# Patient Record
Sex: Female | Born: 2000 | Race: Black or African American | Hispanic: No | Marital: Single | Smoking: Never smoker
Health system: Southern US, Community
[De-identification: ages and names within clinical notes are randomized; demographics above are authoritative.]

## PROBLEM LIST (undated history)

## (undated) DIAGNOSIS — E282 Polycystic ovarian syndrome: Secondary | ICD-10-CM

## (undated) DIAGNOSIS — T7840XA Allergy, unspecified, initial encounter: Secondary | ICD-10-CM

## (undated) DIAGNOSIS — F419 Anxiety disorder, unspecified: Secondary | ICD-10-CM

## (undated) DIAGNOSIS — E559 Vitamin D deficiency, unspecified: Secondary | ICD-10-CM

## (undated) DIAGNOSIS — D649 Anemia, unspecified: Secondary | ICD-10-CM

## (undated) DIAGNOSIS — E119 Type 2 diabetes mellitus without complications: Secondary | ICD-10-CM

## (undated) DIAGNOSIS — F32A Depression, unspecified: Secondary | ICD-10-CM

## (undated) DIAGNOSIS — R011 Cardiac murmur, unspecified: Secondary | ICD-10-CM

## (undated) HISTORY — DX: Type 2 diabetes mellitus without complications: E11.9

## (undated) HISTORY — DX: Polycystic ovarian syndrome: E28.2

## (undated) HISTORY — DX: Vitamin D deficiency, unspecified: E55.9

## (undated) HISTORY — DX: Allergy, unspecified, initial encounter: T78.40XA

## (undated) HISTORY — PX: TONSILLECTOMY: SHX5217

## (undated) HISTORY — DX: Anemia, unspecified: D64.9

## (undated) HISTORY — PX: TYMPANOSTOMY TUBE PLACEMENT: SHX32

## (undated) HISTORY — DX: Depression, unspecified: F32.A

## (undated) HISTORY — DX: Cardiac murmur, unspecified: R01.1

## (undated) HISTORY — DX: Anxiety disorder, unspecified: F41.9

---
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
## 2020-01-17 ENCOUNTER — Other Ambulatory Visit: Payer: Self-pay

## 2020-01-17 DIAGNOSIS — Z79811 Long term (current) use of aromatase inhibitors: Secondary | ICD-10-CM | POA: Diagnosis not present

## 2020-01-17 DIAGNOSIS — R519 Headache, unspecified: Secondary | ICD-10-CM | POA: Insufficient documentation

## 2020-01-17 DIAGNOSIS — Z79891 Long term (current) use of opiate analgesic: Secondary | ICD-10-CM | POA: Diagnosis not present

## 2020-01-17 DIAGNOSIS — Z79899 Other long term (current) drug therapy: Secondary | ICD-10-CM | POA: Diagnosis not present

## 2020-01-17 DIAGNOSIS — F4321 Adjustment disorder with depressed mood: Secondary | ICD-10-CM | POA: Diagnosis not present

## 2020-01-17 DIAGNOSIS — F310 Bipolar disorder, current episode hypomanic: Secondary | ICD-10-CM | POA: Diagnosis not present

## 2020-01-17 DIAGNOSIS — T1491XA Suicide attempt, initial encounter: Secondary | ICD-10-CM | POA: Diagnosis present

## 2020-01-18 ENCOUNTER — Emergency Department (HOSPITAL_COMMUNITY)
Admission: EM | Admit: 2020-01-18 | Discharge: 2020-01-18 | Disposition: A | Discharge status: Home / Self Care | Payer: Medicaid Other | Attending: Emergency Medicine | Admitting: Emergency Medicine

## 2020-01-18 ENCOUNTER — Encounter (HOSPITAL_COMMUNITY): Payer: Self-pay | Admitting: Emergency Medicine

## 2020-01-18 ENCOUNTER — Other Ambulatory Visit: Payer: Self-pay

## 2020-01-18 DIAGNOSIS — R4589 Other symptoms and signs involving emotional state: Secondary | ICD-10-CM

## 2020-01-18 LAB — COMPREHENSIVE METABOLIC PANEL
ALT: 24 U/L (ref 0–44)
AST: 29 U/L (ref 15–41)
Albumin: 5 g/dL (ref 3.5–5.0)
Alkaline Phosphatase: 53 U/L (ref 38–126)
Anion gap: 10 (ref 5–15)
BUN: 9 mg/dL (ref 6–20)
CO2: 25 mmol/L (ref 22–32)
Calcium: 9.4 mg/dL (ref 8.9–10.3)
Chloride: 102 mmol/L (ref 98–111)
Creatinine, Ser: 0.75 mg/dL (ref 0.44–1.00)
GFR calc Af Amer: >60 mL/min (ref >60)
GFR calc non Af Amer: >60 mL/min (ref >60)
Glucose, Bld: 101 mg/dL — ABNORMAL HIGH (ref 70–99)
Potassium: 4 mmol/L (ref 3.5–5.1)
Sodium: 137 mmol/L (ref 135–145)
Total Bilirubin: 0.6 mg/dL (ref 0.3–1.2)
Total Protein: 8.3 g/dL — ABNORMAL HIGH (ref 6.5–8.1)

## 2020-01-18 LAB — RAPID URINE DRUG SCREEN, HOSP PERFORMED
Amphetamines: POSITIVE — AB
Barbiturates: NOT DETECTED
Benzodiazepines: NOT DETECTED
Cocaine: NOT DETECTED
Opiates: NOT DETECTED
Tetrahydrocannabinol: NOT DETECTED

## 2020-01-18 LAB — CBC
HCT: 37 % (ref 36.0–46.0)
Hemoglobin: 11.9 g/dL — ABNORMAL LOW (ref 12.0–15.0)
MCH: 27.3 pg (ref 26.0–34.0)
MCHC: 32.2 g/dL (ref 30.0–36.0)
MCV: 84.9 fL (ref 80.0–100.0)
Platelets: 318 10*3/uL (ref 150–400)
RBC: 4.36 MIL/uL (ref 3.87–5.11)
RDW: 14.5 % (ref 11.5–15.5)
WBC: 8.3 10*3/uL (ref 4.0–10.5)
nRBC: 0 % (ref 0.0–0.2)

## 2020-01-18 LAB — ETHANOL: Alcohol, Ethyl (B): <10 mg/dL (ref <10)

## 2020-01-18 LAB — ACETAMINOPHEN LEVEL: Acetaminophen (Tylenol), Serum: <10 ug/mL — ABNORMAL LOW (ref 10–30)

## 2020-01-18 LAB — I-STAT BETA HCG BLOOD, ED (MC, WL, AP ONLY): I-stat hCG, quantitative: <5 m[IU]/mL (ref <5)

## 2020-01-18 LAB — SALICYLATE LEVEL: Salicylate Lvl: <7 mg/dL — ABNORMAL LOW (ref 7.0–30.0)

## 2020-01-18 MED ORDER — ZOLPIDEM TARTRATE 5 MG PO TABS: 5.0000 mg | ORAL_TABLET | Freq: Every evening | ORAL | Status: DC | PRN

## 2020-01-18 MED ORDER — ACETAMINOPHEN 325 MG PO TABS: 650.0000 mg | ORAL_TABLET | ORAL | Status: DC | PRN

## 2020-01-18 MED ORDER — ALUM & MAG HYDROXIDE-SIMETH 200-200-20 MG/5ML PO SUSP: 30.0000 mL | Freq: Four times a day (QID) | ORAL | Status: DC | PRN

## 2020-01-18 NOTE — ED Triage Notes (Signed)
Patient is complaining of wanting to kill herself. Patient was put on ziprasidone 1/2 of tab. Patient states that the pharmacy did not have that dose and put her on a whole capsule. Patient states she thought it to much and stopped taking it. Patient states that now she feel like killing herself.

## 2020-01-18 NOTE — BH Assessment (Signed)
Comprehensive Clinical Assessment (CCA) Note  01/18/2020 Kellie Kline [id number]  Pt is a 19 year old single female who presents unaccompanied to [facility] ED with concerns that her medication is causing abnormal mood. Pt reports a diagnosis of bipolar disorder and ADHD. She says her psychiatrist, Dr Gerrit Heck, put on 20 mg ziprasidone capsules.  She was supposed to be put on a half of a dose but the pharmacy did not carry this.  She is worried that she is taking too much. She reports that for the past week she has experienced negative thoughts, lack of feelings, social withdrawal and "extreme self-worth" of believing that she is "above everything else." She states that she has "lies a lot" and that she has no feelings of guilt regarding this, which is unusual. She says she has been feeling distant from her family and other people. She says she has been focused on recording all of her experiences in her journal. Pt reports she has been sleeping less and did not sleep at all last night. She denies depressive symptoms. RN in triage documented that Pt was experiencing suicidal ideation but Pt states that is incorrect, that she has no suicidal thoughts. Pt denies any history of suicide attempts or intentional self-injurious behaviors. She reports a history of experiencing auditory hallucinations of people talking but denies current auditory or visual hallucinations. She denies homicidal ideation or history of violence. She denies alcohol or other substance use.   Pt identifies school as her primary stressor. 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 She reports her biological mother was unable to care for her when she was a child and she was raised by her second cousin, whom she calls Mom. She identifies Mom and her sister as her primary supports. She denies history of abuse but believes she was neglected as a child by her biological mother. Pt  denies legal problems. She denies access to firearms.   Pt reports she sees a therapist, [name], every two weeks and a psychiatrist, Dr. Toma Deiters, monthly at [facility]. She says she is scheduled to see both providers next week. She denies any history of inpatient psychiatric treatment.   Pt is casually dressed, alert and oriented x4. Pt speaks in a clear tone, at moderate volume and normal pace. Motor behavior appears normal. Eye contact is good. Pt's mood is euthymic and affect is congruent with mood. Thought process is coherent and relevant. There is no indication Pt is currently responding to internal stimuli or experiencing delusional thought content. Pt was calm and cooperative throughout assessment. She says she has no safety concerns and is not interested in inpatient psychiatric treatment.   Visit Diagnosis:   F31.0 Bipolar I disorder, Current or most recent episode hypomanic    DISPOSITION: Gave clinical report to Gillermo Murdoch, NP who said Pt does not meet criteria for inpatient psychiatric treatment. Recommendation is for Pt to keep appointment with therapist, [name], on 01/20/2020 and with psychiatrist, Gerrit Heck, MD on 01/22/2020. Notified Dr. Pricilla Loveless and Linna Caprice, RN of recommendation.   PHQ9 SCORE ONLY 01/18/2020  PHQ-9 Total Score 6    CCA Screening, Triage and Referral (STR)  Patient Reported Information How did you hear about [place]? Self  Referral name: No data recorded Referral phone number: No data recorded  Whom do you see for routine medical problems? Other (Comment) (Pt's psychiatrist is Gerrit Heck, MD)  Practice/Facility Name: No data  recorded Practice/Facility Phone Number: No data recorded Name of Contact: No data recorded Contact Number: No data recorded Contact Fax Number: No data recorded Prescriber Name: No data recorded Prescriber Address (if known): No data recorded  What Is the Reason for Your Visit/Call Today?  Pt reports having negative thoughts and lack of feelings for the past week. She believes this is a result of accidentally taking twice the prescribed dosage of ziprasidone.  How Long Has This Been Causing You Problems? 1 wk - 1 month  What Do You Feel Would Help You the Most Today? Assessment Only   Have You Recently Been in Any Inpatient Treatment (Hospital/Detox/Crisis Center/28-Day Program)? No  Name/Location of Program/Hospital:No data recorded How Long Were You There? No data recorded When Were You Discharged? No data recorded  [facility]? [facility]  [facility]   Have You Recently Had Any Thoughts About Hurting Yourself? No  Are You Planning to Commit Suicide/Harm Yourself At This time? No   Have you Recently Had Thoughts About Hurting Someone Karolee Ohslse? No  Explanation: No data recorded  Have You Used Any Alcohol or Drugs in the Past 24 Hours? No  How Long Ago Did You Use Drugs or Alcohol? No data recorded What Did You Use and How Much? No data recorded  Do You Currently Have a Therapist/Psychiatrist? Yes  Name of Therapist/Psychiatrist: Psychiatrist: Gerrit HeckMary Ameh, MD. Therapist: Salome Spottedia Turner   Have You Been Recently Discharged From Any Office Practice or Programs? No  Explanation of Discharge From Practice/Program: No data recorded    CCA Screening Triage Referral Assessment Type of Contact: Tele-Assessment  Is this Initial or Reassessment? Initial Assessment  Date Telepsych consult ordered in [facility]:  01/18/20  Time [facility]psych consult [facility]:  0055   Patient Reported Information Reviewed? Yes  Patient Left Without Being Seen? No data recorded Reason for Not Completing Assessment: No data recorded  Collateral Involvement: None   Does Patient Have a Court Appointed Legal Guardian? No data recorded Name and Contact of Legal Guardian: No data recorded If Minor and Not Living with  Parent(s), Who has Custody? No data recorded Is CPS involved or ever been involved? Never  Is APS involved or ever been involved? Never   Patient Determined To Be At Risk for Harm To Self or Others Based on Review of Patient Reported Information or Presenting Complaint? No  Method: No data recorded Availability of Means: No data recorded Intent: No data recorded Notification Required: No data recorded Additional Information for Danger to Others Potential: No data recorded Additional Comments for Danger to Others Potential: No data recorded Are There Guns or Other Weapons in Your Home? No data recorded Types of Guns/Weapons: No data recorded Are These Weapons Safely Secured?                            No data recorded Who Could Verify You Are Able To Have These Secured: No data recorded Do You Have any Outstanding Charges, Pending Court Dates, Parole/Probation? No data recorded Contacted To Inform of Risk of Harm To Self or Others: Other: Comment (None)   Location of Assessment: [facility] ED   Does Patient Present under Involuntary Commitment? No  IVC Papers Initial File Date: No data recorded  [place]   Patient Currently Receiving the Following Services: Medication Management;Individual Therapy   Determination of Need:  Emergent (2 hours)   Options For Referral: Outpatient Therapy;Medication Management     CCA Biopsychosocial  Intake/Chief Complaint:  CCA Intake With Chief Complaint CCA Part Two Date: 01/18/20 CCA Part Two Time: 0125 Chief Complaint/Presenting Problem: The patient was put on 20 mg ziprasidone capsules.  She was supposed to be put on a half of a dose but the pharmacy did not carry this.  She is worried that she is taking too much. Patient's Currently Reported Symptoms/Problems: Pt reports abnormal mood, lack of feelings, negative thinking, and racing thoughts. Individual's Strengths: Pt is motivated for treatment and follows  recommendations of her providers. Individual's Preferences: None identified Individual's Abilities: Pt reports writing skills Type of Services Patient Feels Are Needed: Evaluation of medication Initial Clinical Notes/Concerns: NA  Mental Health Symptoms Depression:  Depression: Change in energy/activity, Difficulty Concentrating, Sleep (too much or little)  Mania:  Mania: Change in energy/activity, Increased Energy, Overconfidence, Racing thoughts  Anxiety:   Anxiety: None  Psychosis:  Psychosis: Hallucinations (Pt reports episodes of hearing voices. Denies current hallucinations.)  Trauma:  Trauma: None  Obsessions:  Obsessions: None  Compulsions:  Compulsions: None  Inattention:  Inattention: None  Hyperactivity/Impulsivity:  Hyperactivity/Impulsivity: N/A  Oppositional/Defiant Behaviors:  Oppositional/Defiant Behaviors: N/A  Emotional Irregularity:  Emotional Irregularity: None  Other Mood/Personality Symptoms:  Other Mood/Personality Symptoms: Pt reports lack of feeling and emotion   Mental Status Exam Appearance and self-care  Stature:  Stature: Average  Weight:  Weight: Overweight  Clothing:  Clothing: Casual  Grooming:  Grooming: Normal  Cosmetic use:  Cosmetic Use: Age appropriate  Posture/gait:  Posture/Gait: Normal  Motor activity:  Motor Activity: Not Remarkable  Sensorium  Attention:  Attention: Normal  Concentration:  Concentration: Normal  Orientation:  Orientation: Person, Place, Situation, Time  Recall/memory:  Recall/Memory: Normal  Affect and Mood  Affect:  Affect: Appropriate  Mood:  Mood: Other (Comment) (Pt reports lack of feelings)  Relating  Eye contact:  Eye Contact: Normal  Facial expression:  Facial Expression: Responsive  Attitude toward examiner:  Attitude Toward Examiner: Cooperative  Thought and Language  Speech flow: Speech Flow: Clear and Coherent, Normal  Thought content:  Thought Content: Appropriate to Mood and Circumstances   Preoccupation:  Preoccupations: None  Hallucinations:  Hallucinations: None  Organization:     Company secretary of Knowledge:  Fund of Knowledge: Average  Intelligence:  Intelligence: Average  Abstraction:  Abstraction: Normal  Judgement:  Judgement: Normal  Reality Testing:  Reality Testing: Adequate  Insight:  Insight: Fair  Decision Making:  Decision Making: Normal  Social Functioning  Social Maturity:  Social Maturity: Isolates  Social Judgement:  Social Judgement: Normal  Stress  Stressors:  Stressors: School  Coping Ability:  Coping Ability: Normal  Skill Deficits:  Skill Deficits: None  Supports:  Supports: Family, Warehouse manager, Friends/Service system     Religion: Religion/Spirituality Are You A Religious Person?: Yes What is Your Religious Affiliation?: Christian How Might This Affect Treatment?: NA  Leisure/Recreation: Leisure / Recreation Do You Have Hobbies?: Yes Leisure and Hobbies: Journaling and true crime  Exercise/Diet: Exercise/Diet Do You Exercise?: No Have You Gained or Lost A Significant Amount of Weight in the Past Six Months?: No Do You Follow a Special Diet?: No Do You Have Any Trouble Sleeping?: Yes Explanation of Sleeping Difficulties: Decreased sleep   CCA Employment/Education  Employment/Work Situation: Employment / Work Situation Employment situation: Consulting civil engineer Where is patient currently employed?: Pt has work study job as an Advice worker job  has been impacted by current illness: Yes Describe how patient's job has been impacted: Showing up late for work Has patient ever been in the [employer]?: No  Education: Education Is Patient Currently Attending School?: Yes School Currently Attending: [employer] Last Grade Completed: 13 Did You Graduate From [employer]?: Yes Did You Attend College?: Yes What Type of College Degree Do you Have?: Currently in college Did You Attend Graduate School?: No What Was Your Major?:  Accounting Did You Have Any Special Interests In School?: Writing Did You Have An Individualized Education Program (IIEP): Yes Did You Have Any Difficulty At School?: Yes Were Any Medications Ever Prescribed For These Difficulties?: Yes Medications Prescribed For School Difficulties?: ADHD medication Patient's Education Has Been Impacted by Current Illness: Yes How Does Current Illness Impact Education?: ADHD   CCA Family/Childhood History  Family and Relationship History: Family history Marital status: Single Are you sexually active?: No What is your sexual orientation?: Heterosexual Has your sexual activity been affected by drugs, alcohol, medication, or emotional stress?: NA Does patient have children?: No  Childhood History:  Childhood History By whom was/is the patient raised?: Other (Comment) (Second cousin) Additional childhood history information: Biological mother was a teen mother and could not care for Pt Description of patient's relationship with caregiver when they were a child: Pt says she was neglected by biological mother Patient's description of current relationship with people who raised him/her: Good relationship with adoptive mother How were you disciplined when you got in trouble as a child/adolescent?: Spanking Does patient have siblings?: Yes Number of Siblings: 3 Description of patient's current relationship with siblings: Good Did patient suffer any verbal/emotional/physical/sexual abuse as a child?: No Did patient suffer from severe childhood neglect?: No Has patient ever been sexually abused/assaulted/raped as an adolescent or adult?: No Was the patient ever a victim of a crime or a disaster?: No Witnessed domestic violence?: No Has patient been affected by domestic violence as an adult?: No  Child/Adolescent Assessment:     CCA Substance Use  Alcohol/Drug Use: Alcohol / Drug Use Pain Medications: Denies abuse Prescriptions: Denies abuse Over  the Counter: Denies abuse History of alcohol / drug use?: No history of alcohol / drug abuse Longest period of sobriety (when/how long): NA                         ASAM's:  Six Dimensions of Multidimensional Assessment  Dimension 1:  Acute Intoxication and/or Withdrawal Potential:      Dimension 2:  Biomedical Conditions and Complications:      Dimension 3:  Emotional, Behavioral, or Cognitive Conditions and Complications:     Dimension 4:  Readiness to Change:     Dimension 5:  Relapse, Continued use, or Continued Problem Potential:     Dimension 6:  Recovery/Living Environment:     ASAM Severity Score:    ASAM Recommended Level of Treatment:     Substance use Disorder (SUD)    Recommendations for Services/Supports/Treatments:    DSM5 Diagnoses: There are no problems to display for this patient.   Patient Centered Plan: Patient is on the following Treatment Plan(s):     Referrals to Alternative Service(s): Referred to Alternative Service(s):   Place:   Date:   Time:    Referred to Alternative Service(s):   Place:   Date:   Time:    Referred to Alternative Service(s):   Place:   Date:   Time:    Referred to Alternative  Service(s):   Place:   Date:   Time:     Pamalee Leyden, [facility], [facility] Triage Specialist [phone]  Patsy Baltimore, Harlin Rain

## 2020-01-18 NOTE — ED Provider Notes (Addendum)
[facility]-EMERGENCY DEPT Provider Note   CSN: [id number] Arrival date & time: 01/17/20  2256     History Chief Complaint  Patient presents with   Suicide Attempt    Kellie Kline is a 19 y.o. female.  HPI 19 year old female presents with abnormal mood.  The patient was put on 20 mg ziprasidone capsules.  She was supposed to be put on a half of a dose but the pharmacy did not carry this.  She is worried that she is taking too much.  Since being on this for the past week she has felt mood abnormalities including feeling distant from her family and wanting to "remove myself" from the family.  She states this is not a suicidal thought but more withdrawing.  Denies feeling ill with fever, cough, or other acute illness.  However she does states she has had a mild to moderate headache over the last few days.  Seem to get better with ibuprofen.  History reviewed. No pertinent past medical history.  There are no problems to display for this patient.   History reviewed. No pertinent surgical history.   OB History   No obstetric history on file.     History reviewed. No pertinent family history.  Social History   Tobacco Use   Smoking status: Never Smoker   Smokeless tobacco: Never Used  Vaping Use   Vaping Use: Never used  Substance Use Topics   Alcohol use: No   Drug use: No    Home Medications Prior to Admission medications   Medication Sig Start Date End Date Taking? Authorizing Provider  amphetamine-dextroamphetamine (ADDERALL) 10 MG tablet Take 10 mg by mouth 2 (two) times daily. 12/31/19  Yes [provider]  escitalopram (LEXAPRO) 20 MG tablet Take 20 mg by mouth at bedtime.    Yes [provider]  loratadine (CLARITIN) 10 MG tablet Take 1 tablet (10 mg total) by mouth daily. 04/29/12  Yes Chatten, Katherine Basset, NP  Vitamin D, Ergocalciferol, (DRISDOL) 1.25 MG (50000 UNIT) CAPS capsule Take 50,000 Units by mouth once a week. 12/31/19   Yes [provider]  ziprasidone (GEODON) 20 MG capsule Take 20 mg by mouth at bedtime. 01/09/20  Yes [provider]  dicyclomine (BENTYL) 20 MG tablet Take 1 tablet (20 mg total) by mouth 2 (two) times daily. Patient not taking: Reported on 01/18/2020 10/27/17   Joy, Ines Bloomer C, PA-C  ondansetron (ZOFRAN ODT) 4 MG disintegrating tablet Take 1 tablet (4 mg total) by mouth every 8 (eight) hours as needed for nausea or vomiting. 10/27/17   Joy, Shawn C, PA-C  sodium chloride (OCEAN NASAL SPRAY) 0.65 % nasal spray Place 1 spray into the nose as needed for congestion. Patient not taking: Reported on 10/27/2017 04/29/12   Johnsie Kindred, NP    Allergies    Lactose intolerance (gi)  Review of Systems   Review of Systems  Constitutional: Negative for fever.  Respiratory: Negative for cough.   Gastrointestinal: Negative for vomiting.  Neurological: Positive for headaches.  Psychiatric/Behavioral: Positive for dysphoric mood. Negative for suicidal ideas.  All other systems reviewed and are negative.   Physical Exam Updated Vital Signs BP (!) 127/98 (BP Location: Left Arm)    Pulse 99    Temp 98.6 F (37 C) (Oral)    Resp 17    Ht 5\' 6"  (1.676 m)    Wt 72.6 kg    SpO2 100%    BMI 25.82 kg/m   Physical  Exam Vitals and nursing note reviewed.  Constitutional:      General: She is not in acute distress.    Appearance: She is well-developed. She is not ill-appearing or diaphoretic.  HENT:     Head: Normocephalic and atraumatic.     Right Ear: External ear normal.     Left Ear: External ear normal.     Nose: Nose normal.  Eyes:     General:        Right eye: No discharge.        Left eye: No discharge.     Extraocular Movements: Extraocular movements intact.     Pupils: Pupils are equal, round, and reactive to light.  Cardiovascular:     Rate and Rhythm: Normal rate and regular rhythm.     Heart sounds: Normal heart sounds.  Pulmonary:     Effort: Pulmonary effort is  normal.     Breath sounds: Normal breath sounds.  Abdominal:     Palpations: Abdomen is soft.     Tenderness: There is no abdominal tenderness.  Skin:    General: Skin is warm and dry.  Neurological:     Mental Status: She is alert and oriented to person, place, and time.     Comments: CN 3-12 grossly intact. 5/5 strength in all 4 extremities. Grossly normal sensation. Normal finger to nose.   Psychiatric:        Mood and Affect: Mood is not anxious.        Thought Content: Thought content does not include suicidal ideation.     ED Results / Procedures / Treatments   Labs (all labs ordered are listed, but only abnormal results are displayed) Labs Reviewed  COMPREHENSIVE METABOLIC PANEL - Abnormal; Notable for the following components:      Result Value   Glucose, Bld 101 (*)    Total Protein 8.3 (*)    All other components within normal limits  SALICYLATE LEVEL - Abnormal; Notable for the following components:   Salicylate Lvl <7.0 (*)    All other components within normal limits  ACETAMINOPHEN LEVEL - Abnormal; Notable for the following components:   Acetaminophen (Tylenol), Serum <10 (*)    All other components within normal limits  CBC - Abnormal; Notable for the following components:   Hemoglobin 11.9 (*)    All other components within normal limits  RAPID URINE DRUG SCREEN, HOSP PERFORMED - Abnormal; Notable for the following components:   Amphetamines POSITIVE (*)    All other components within normal limits  RESPIRATORY PANEL BY RT PCR (FLU A&B, COVID)  ETHANOL  I-STAT BETA HCG BLOOD, ED ([facility], [facility], [facility] ONLY)    EKG None  Radiology No results found.  Procedures Procedures (including critical care time)  Medications Ordered in ED Medications  acetaminophen (TYLENOL) tablet 650 mg (has no administration in time range)  zolpidem (AMBIEN) tablet 5 mg (has no administration in time range)  alum & mag hydroxide-simeth (MAALOX/MYLANTA) 200-200-20 MG/5ML suspension  30 mL (has no administration in time range)    ED Course  I have reviewed the triage vital signs and the nursing notes.  Pertinent labs & imaging results that were available during my care of the patient were reviewed by me and considered in my medical decision making (see chart for details).    MDM Rules/Calculators/A&P                          Patient has  a headache that seems benign on exam and history.  Otherwise, vitals are unremarkable.  She is not suicidal but seems to have dysphoric mood.  TTS has seen and staffed with psychiatry who recommends outpatient management.  I think this is reasonable.  We discussed return precautions including developing suicidal thoughts. Labs reviewed and are unremarkable. Final Clinical Impression(s) / ED Diagnoses Final diagnoses:  Depressed mood    Rx / DC Orders ED Discharge Orders    None       Pricilla Loveless, MD 01/18/20 0865    Pricilla Loveless, MD 01/18/20 [phone]

## 2020-10-12 ENCOUNTER — Ambulatory Visit: Payer: No Typology Code available for payment source | Attending: Family Medicine

## 2020-10-12 ENCOUNTER — Other Ambulatory Visit: Payer: Self-pay

## 2020-10-12 DIAGNOSIS — R41841 Cognitive communication deficit: Secondary | ICD-10-CM | POA: Diagnosis present

## 2020-10-12 NOTE — Patient Instructions (Signed)
Strategies for Improving Your Attention and Memory  Use good eye-contact Give the speaker your undivided attention Look directly at the speaker  Complete one task at a time Avoid multitasking Complete one task before starting a new one Write a note to yourself if you think of something else that needs to be done Let others know when you need quiet time and can't be interrupted Don't answer the phone, texts, or emails while you are working on another task  Put aside distracting thoughts If you find your mind wandering, refocus your attention on the speaker Avoid off-topic comments or responses that may divert your attention  If something important comes to mind, let the speaker know and pause to write yourself a note: "Do you mind holding on a minute, I have to write something down." Put thoughts on hold and focus on salient information  Limit distractions in your environment Think about the environment around you Limit background noise by turning off the TV or music, putting your phone away Close the door and work in quiet  Use active listening Actively participate in the conversation to stay focused Paraphrase what you have heard to include the most important details Adding some associations may help you remember Ask questions to clarify certain points Summarize the speaker's comments periodically Avoid nodding your head and using "mhm" responses as these are more passive and don't help your attention  Alert the other person/people It may be helpful to alert your listener to the fact that you may need reminders to keep on track Tell the speaker in advance that you may need to stop them and have them repeat salient information If you lose focus, interject and let the person know, "I'm sorry, I lost you, can you tell me again?"  Write down information Write down pertinent information as it comes up, such as telephone numbers, names of people, addresses, details from appointments  and conversations, etc.   Memory Compensation Strategies  Use "WARM" strategy. W= write it down A=  associate it R=  repeat it M=  make a mental picture  You can keep a Memory Notebook. Use a 3-ring notebook with sections for the following:  calendar, important names and phone numbers, medications, doctors' names/phone numbers, "to do list"/reminders, and a section to journal what you did each day  Use a calendar to write appointments down.  Write yourself a schedule for the day.  This can be placed on the calendar or in a separate section of the Memory Notebook.  Keeping a regular schedule can help memory.  Use medication organizer with sections for each day or morning/evening pills  You may need help loading it  Keep a basket, or pegboard by the door.   Place items that you need to take out with you in the basket or on the pegboard.  You may also want to include a message board for reminders.  Use sticky notes. Place sticky notes with reminders in a place where the task is performed.  For example:  "turn off the stove" placed by the stove, "lock the door" placed on the door at eye level, "take your medications" on the bathroom mirror or by the place where you normally take your medications  Use alarms/timers.  Use while cooking to remind yourself to check on food or as a reminder to take your medicine, or as a reminder to make a call, or as a reminder to perform another task, etc.  Use a voice recorder app or small  tape recorder to record important information and notes for yourself. Go back at the end of the day and listen to these.

## 2020-10-12 NOTE — Therapy (Signed)
[facility] [address] [place], [place], [address] Phone: [phone]   Fax:  [phone]  Speech Language Pathology Evaluation  Patient Details  Name: Kellie Kline MRN: [id number] Date of Birth: April 10, 2001 Referring Provider (SLP): Renaye Rakers, MD   Encounter Date: 10/12/2020   End of Session - 10/12/20 1741     Visit Number 1    Number of Visits 9    Date for SLP Re-Evaluation 12/07/20    SLP Start Time 1650    SLP Stop Time  1735    SLP Time Calculation (min) 45 min    Activity Tolerance Patient tolerated treatment well             No past medical history on file.  No past surgical history on file.  There were no vitals filed for this visit.       SLP Evaluation [facility] - 10/12/20 1653       SLP Visit Information   SLP Received On 10/06/20    Referring Provider (SLP) Renaye Rakers, MD    Onset Date ~2 years ago    Medical Diagnosis Cognitive deficits      Balance Screen   Has the patient fallen in the past 6 months No      Prior Functional Status   Cognitive/Linguistic Baseline Baseline deficits    Baseline deficit details attention deficits- ADHD    Type of Home House     Lives With [facility]    Available Support Friend(s)    Education 2nd year at [employer] (accounting major)   taking a break next semester   Vocation Full time employment   front office     Cognition   Overall Cognitive Status Impaired/Different from baseline    Area of Impairment Attention;Memory;Problem solving    Current Attention Level Divided    Attention Comments difficulty with focus    Memory Decreased short-term memory    Memory Comments can't recall things in conversation, reliant on writing down information to recall    Problem Solving Slow processing;Difficulty sequencing    Problem Solving Comments delayed processing      Auditory Comprehension   Overall Auditory Comprehension Impaired    Conversation Moderately  complex    Interfering Components Attention;Anxiety;Hearing;Working Development worker, international aid Within [employer]      Reading Comprehension   Reading Status Not tested      Expression   Primary Mode of Expression Verbal      Verbal Expression   Overall Verbal Expression Appears within functional limits for tasks assessed      Oral Motor/Sensory Function   Overall Oral Motor/Sensory Function Appears within functional limits for tasks assessed      Motor Speech   Overall Motor Speech Appears within functional limits for tasks assessed      Standardized Assessments   Standardized Assessments  Cognitive Linguistic Quick Test      Cognitive Linguistic Quick Test (Ages 18-69)   Attention WNL   low end of WNL   Memory Mild    Executive Function Mild    Language WNL   low end of WNL   Visuospatial Skills Mild    Severity Rating Total 17    Composite Severity Rating 14.6                             SLP Education - 10/12/20 1740  Education Details eval results, possible goals, memory/attention compensations    Person(s) Educated Patient    Methods Explanation;Demonstration;Handout    Comprehension Verbalized understanding;Returned demonstration;Need further instruction              SLP Short Term Goals - 10/12/20 1742       SLP SHORT TERM GOAL #1   Title Pt will utilize 3 memory/attention compensations to improve performance at work/school with occasional min A over 2 sessions    Time 4    Period Weeks    Status New      SLP SHORT TERM GOAL #2   Title Pt will answer (yes/no/WH-) questions regarding paragraph length information with 80% accuracy given occasional cues over 2 sessions    Time 4    Period Weeks    Status New      SLP SHORT TERM GOAL #3   Title Pt will comprehend multi-step directions to successfully complete mod complex tasks with rare min A over 2 sessions    Time 4     Period Weeks    Status New      SLP SHORT TERM GOAL #4   Title Pt will comprehend 10+ minute mod complex conversation with less than 3 repetitions over 2 sessions    Time 4    Period Weeks    Status New      SLP SHORT TERM GOAL #5   Title Pt will complete cognitive PROM in first 1-2 sessions    Time 2    Period Weeks    Status New              SLP Long Term Goals - 10/12/20 1744       SLP LONG TERM GOAL #1   Title Pt will utilize 5 memory/attention compensations to improve performance at work/school with rare min A over 2 sessions    Time 8    Period Weeks    Status New      SLP LONG TERM GOAL #2   Title Pt will answer (yes/no/WH-) questions regarding multi-paragraph length information with 80% accuracy given rare cues over 2 sessions    Time 8    Period Weeks    Status New      SLP LONG TERM GOAL #3   Title Pt will comprehend 20+ minute mod complex conversation with less than 3 repetitions over 2 sessions    Time 8    Period Weeks    Status New      SLP LONG TERM GOAL #4   Title Pt will indicate improved cognitive functioning via PROM by 2 points at last ST session    Time 8    Period Weeks    Status New              Plan - 10/12/20 1746     Clinical Impression Statement Kellie Kline presents for OPST evaluation secondary to "cognitive deficit." Pt was unaccompanied during evaluation. Pt reports she has been experiencing difficulty with processing and comprehension over last 2 years, although no neurological events reported or documented. Pt often requires repetition in conversation with family and coworkers due to "not understanding what they are saying." Pt reportedly passed hearing screener at last PCP visit (full audiological evaluation may be warranted if pt does not progress during ST intervention). Cognitive Linguistic Quick Test (CLQT) completed this session, which revealed mild memory, executive function, and visuospatial deficits. Pt scored at very low end  of WNL for attention and language.  Pt reports memory, attention, and processing deficits have impacted her at college and at work. Pt finished sophmore year of college and is studying accounting. Pt reported inabilty to pass classes due to reduced comprehension and processing speed; therefore, pt ultimately took gap semester from school due to deficits. Pt currently works full-time in a front office. Pt reports difficulty comprehending and following conversations while speaking to patients over the phone at work. Given the impact of her current cognitive linguistic functioning on school and work performance, SLP recommends skilled ST intervention to address mild cognitive lingusitic deficits to optimize overall cognitive functioning and maximize functional independence.    Speech Therapy Frequency 1x /week    Duration 8 weeks    Treatment/Interventions Compensatory strategies;Functional tasks;Cueing hierarchy;Cognitive reorganization;Language facilitation;Compensatory techniques;Internal/external aids;SLP instruction and feedback    Potential to Achieve Goals Fair    Potential Considerations Previous level of function    SLP Home Exercise Plan provided    Consulted and Agree with Plan of Care Patient             Patient will benefit from skilled therapeutic intervention in order to improve the following deficits and impairments:   Cognitive communication deficit    Problem List There are no problems to display for this patient.   Janann Colonel, MA CCC-SLP 10/12/2020, 6:08 PM  [facility] [address] [place], [place], [address] Phone: [phone]   Fax:  [phone]  Name: Kellie Kline MRN: [id number] Date of Birth: 2000-10-24

## 2020-10-20 ENCOUNTER — Ambulatory Visit: Payer: No Typology Code available for payment source | Attending: Family Medicine

## 2020-10-20 ENCOUNTER — Other Ambulatory Visit: Payer: Self-pay

## 2020-10-20 DIAGNOSIS — R41841 Cognitive communication deficit: Secondary | ICD-10-CM | POA: Insufficient documentation

## 2020-10-20 NOTE — Patient Instructions (Addendum)
  Constant Therapy app- 14 free day trial  Ted Talks - watch 10-20 mins without closed captions  -provide summary for next ST session   Remember, try to limit background noise as much as possible (reduce distance between listeners & make eye contact/turn body towards listener)   Attention and memory are linked - you need to focus in order to remember!

## 2020-10-20 NOTE — Therapy (Signed)
[facility] [address] [place], [place], [address] Phone: [phone]   Fax:  [phone]  Speech Language Pathology Treatment  Patient Details  Name: Kellie Kline MRN: [id number] Date of Birth: Apr 25, 2000 Referring Provider (SLP): Renaye Rakers, MD   Encounter Date: 10/20/2020   End of Session - 10/20/20 1736     Visit Number 2    Number of Visits 9    Date for SLP Re-Evaluation 12/07/20    SLP Start Time 1740    SLP Stop Time  1825    SLP Time Calculation (min) 45 min    Activity Tolerance Patient tolerated treatment well             History reviewed. No pertinent past medical history.  History reviewed. No pertinent surgical history.  There were no vitals filed for this visit.   Subjective Assessment - 10/20/20 1737     Subjective "good"    Currently in Pain? No/denies                   ADULT SLP TREATMENT - 10/20/20 1735       General Information   Behavior/Cognition Alert;Cooperative;Pleasant mood      Treatment Provided   Treatment provided Cognitive-Linquistic      Cognitive-Linquistic Treatment   Treatment focused on Cognition;Patient/family/caregiver education    Skilled Treatment Pt completed Cognitive Function- Short Form, with the score of 23. SLP reviewed current cognitive deficits, including recall and comprehension of auditory information and attention. SLP re-educated patient on attention and memory compensations to utilize at home/work to aid fuctioning. SLP targeted recall of auditory information, in which pt able to immediately recall information from paragraphs with 93% without background noise. When instrumental background noise inserted, recall accuracy decreased to 50%. Accuracy improved to 60% when background noise resolved. SLP educated patient on eliminating distractions as much as possible.      Assessment / Recommendations / Plan   Plan Continue with current plan of  care      Progression Toward Goals   Progression toward goals Progressing toward goals              SLP Education - 10/20/20 1818     Education Details attention compensations, functional practice, communication strategies    Person(s) Educated Patient    Methods Explanation;Demonstration;Handout    Comprehension Verbalized understanding;Returned demonstration;Need further instruction              SLP Short Term Goals - 10/20/20 1736       SLP SHORT TERM GOAL #1   Title Pt will utilize 3 memory/attention compensations to improve performance at work/school with occasional min A over 2 sessions    Time 4    Period Weeks    Status On-going      SLP SHORT TERM GOAL #2   Title Pt will answer (yes/no/WH-) questions regarding paragraph length information with 80% accuracy given occasional cues over 2 sessions    Time 4    Period Weeks    Status On-going      SLP SHORT TERM GOAL #3   Title Pt will comprehend multi-step directions to successfully complete mod complex tasks with rare min A over 2 sessions    Time 4    Period Weeks    Status On-going      SLP SHORT TERM GOAL #4   Title Pt will comprehend 10+ minute mod complex conversation with less than 3 repetitions over 2 sessions  Time 4    Period Weeks    Status On-going      SLP SHORT TERM GOAL #5   Title Pt will complete cognitive PROM in first 1-2 sessions    Baseline Cog Function- SF: 23    Status Achieved              SLP Long Term Goals - 10/20/20 1736       SLP LONG TERM GOAL #1   Title Pt will utilize 5 memory/attention compensations to improve performance at work/school with rare min A over 2 sessions    Time 8    Period Weeks    Status On-going      SLP LONG TERM GOAL #2   Title Pt will answer (yes/no/WH-) questions regarding multi-paragraph length information with 80% accuracy given rare cues over 2 sessions    Time 8    Period Weeks    Status On-going      SLP LONG TERM GOAL #3    Title Pt will comprehend 20+ minute mod complex conversation with less than 3 repetitions over 2 sessions    Time 8    Period Weeks    Status On-going      SLP LONG TERM GOAL #4   Title Pt will indicate improved cognitive functioning via PROM by 2 points at last ST session    Time 8    Period Weeks    Status On-going              Plan - 10/20/20 1736     Clinical Impression Statement Yarelis presents for OPST intervention for cognitive changes. SLP re-educated attention and memory compensations, including techniques to apply at home. Given baseline attention and memory difficulties, pt would continue to benefit from compensations. Pt noted to recall and retain information better without background noise, seemingly related to attention. Given the impact of her current cognitive linguistic functioning on school and work performance, SLP recommends skilled ST intervention to address mild cognitive lingusitic deficits to optimize overall cognitive functioning and maximize functional independence.    Speech Therapy Frequency 1x /week    Duration 8 weeks    Treatment/Interventions Compensatory strategies;Functional tasks;Cueing hierarchy;Cognitive reorganization;Language facilitation;Compensatory techniques;Internal/external aids;SLP instruction and feedback    Potential to Achieve Goals Fair    Potential Considerations Previous level of function    SLP Home Exercise Plan provided    Consulted and Agree with Plan of Care Patient             Patient will benefit from skilled therapeutic intervention in order to improve the following deficits and impairments:   Cognitive communication deficit    Problem List There are no problems to display for this patient.   Janann Colonel, MA CCC-SLP 10/20/2020, 6:32 PM  [facility] [address] [place], [place], [address] Phone: [phone]   Fax:  [phone]   Name: Marlicia Sroka MRN: [id number] Date of Birth: 2000/12/30

## 2020-10-26 ENCOUNTER — Ambulatory Visit: Payer: No Typology Code available for payment source

## 2020-10-27 ENCOUNTER — Ambulatory Visit: Payer: No Typology Code available for payment source

## 2020-10-27 ENCOUNTER — Other Ambulatory Visit: Payer: Self-pay

## 2020-10-27 DIAGNOSIS — R41841 Cognitive communication deficit: Secondary | ICD-10-CM

## 2020-10-27 NOTE — Therapy (Signed)
[facility] [address] [place], [place], [address] Phone: [phone]   Fax:  [phone]  Speech Language Pathology Treatment  Patient Details  Name: Kellie Kline MRN: [id number] Date of Birth: 24-Jul-2000 Referring Provider (SLP): Renaye Rakers, MD   Encounter Date: 10/27/2020   End of Session - 10/27/20 1751     Visit Number 3    Number of Visits 9    Date for SLP Re-Evaluation 12/07/20    SLP Start Time 1747    SLP Stop Time  1830    SLP Time Calculation (min) 43 min    Activity Tolerance Patient tolerated treatment well             History reviewed. No pertinent past medical history.  History reviewed. No pertinent surgical history.  There were no vitals filed for this visit.   Subjective Assessment - 10/27/20 1748     Subjective "I was so caught up on stuff that I didn't do the homework"    Currently in Pain? No/denies                   ADULT SLP TREATMENT - 10/27/20 1748       General Information   Behavior/Cognition Alert;Cooperative;Pleasant mood      Treatment Provided   Treatment provided Cognitive-Linquistic      Cognitive-Linquistic Treatment   Treatment focused on Cognition;Patient/family/caregiver education    Skilled Treatment Pt forgot HWK at home and also indicated she did not complete certain components due to other limitations. SLP targeted use of memory compensations to recall list of 16 words using repetition, associations, and mental picture. Occasional fading to rare min A required to implement strategies. Pt also benefited from chunking or micro categorizations of words. Pt able to recall words after 5 minutes with 100% accuracy. SLP targeted recall and attention of number sequences to facilitate recall of numbers (telephone, MRN) at work. Pt able to recall and organize 4 numbers from largest to smallest with 90% accuracy. Clarification of instructions required x1 for  novel number task, as pt did not attend to new instructions provided by SLP. Pt able to recall 7 number sequences with improved accuracy in silence versus background. Writing aided recall when background noise present. SLP recommended patient slowly incorporate background noise into daily tasks to train attention and focus, as pt reportedly works best in pure silence which is not always functional.      Assessment / Recommendations / Plan   Plan Continue with current plan of care      Progression Toward Goals   Progression toward goals Progressing toward goals              SLP Education - 10/27/20 1752     Education Details memory compensations, functional application, working in background noise to test attention skills    Person(s) Educated Patient    Methods Explanation;Demonstration;Handout    Comprehension Verbalized understanding;Returned demonstration;Need further instruction              SLP Short Term Goals - 10/27/20 1752       SLP SHORT TERM GOAL #1   Title Pt will utilize 3 memory/attention compensations to improve performance at work/school with occasional min A over 2 sessions    Time 3    Period Weeks    Status On-going      SLP SHORT TERM GOAL #2   Title Pt will answer (yes/no/WH-) questions regarding paragraph length information with 80% accuracy  given occasional cues over 2 sessions    Time 3    Period Weeks    Status On-going      SLP SHORT TERM GOAL #3   Title Pt will comprehend multi-step directions to successfully complete mod complex tasks with rare min A over 2 sessions    Time 3    Period Weeks    Status On-going      SLP SHORT TERM GOAL #4   Title Pt will comprehend 10+ minute mod complex conversation with less than 3 repetitions over 2 sessions    Time 3    Period Weeks    Status On-going      SLP SHORT TERM GOAL #5   Title Pt will complete cognitive PROM in first 1-2 sessions    Baseline Cog Function- SF: 23    Status Achieved               SLP Long Term Goals - 10/27/20 1752       SLP LONG TERM GOAL #1   Title Pt will utilize 5 memory/attention compensations to improve performance at work/school with rare min A over 2 sessions    Time 7    Period Weeks    Status On-going      SLP LONG TERM GOAL #2   Title Pt will answer (yes/no/WH-) questions regarding multi-paragraph length information with 80% accuracy given rare cues over 2 sessions    Time 7    Period Weeks    Status On-going      SLP LONG TERM GOAL #3   Title Pt will comprehend 20+ minute mod complex conversation with less than 3 repetitions over 2 sessions    Time 7    Period Weeks    Status On-going      SLP LONG TERM GOAL #4   Title Pt will indicate improved cognitive functioning via PROM by 2 points at last ST session    Time 7    Period Weeks    Status On-going              Plan - 10/27/20 1834     Clinical Impression Statement Kellie Kline presents for OPST intervention for cognitive changes. SLP re-educated attention and memory compensations, including associations, repetition, and mental picture. Given baseline attention and memory difficulties, pt would continue to benefit from compensations. Pt noted to recall and retain number sequences better without background noise, seemingly related to attention. SLP recommended pt slowly incorporate background noise into daily tasks to optimize attention and focus skills. Given the impact of her current cognitive linguistic functioning on school and work performance, SLP recommends skilled ST intervention to address mild cognitive lingusitic deficits to optimize overall cognitive functioning and maximize functional independence.    Speech Therapy Frequency 1x /week    Duration 8 weeks    Treatment/Interventions Compensatory strategies;Functional tasks;Cueing hierarchy;Cognitive reorganization;Language facilitation;Compensatory techniques;Internal/external aids;SLP instruction and feedback    Potential  to Achieve Goals Fair    Potential Considerations Previous level of function    SLP Home Exercise Plan provided    Consulted and Agree with Plan of Care Patient             Patient will benefit from skilled therapeutic intervention in order to improve the following deficits and impairments:   Cognitive communication deficit    Problem List There are no problems to display for this patient.   Janann Colonel, MA CCC-SLP 10/27/2020, 6:36 PM  [facility]  [address] [place], [place], [address] Phone: [phone]   Fax:  [phone]   Name: Kellie Kline MRN: [id number] Date of Birth: 26-Jul-2000

## 2020-10-27 NOTE — Patient Instructions (Addendum)
  Constant Therapy app- 14 free day trial   Ted Talks - watch 10-20 mins without closed captions -provide summary for next ST session  Add in background noise while you are studying or journaling. Think about leaving the door open, television on, and working in more stimulating environments in order to help attention in more busy environments. Start on a low level to start with and build up volume if you are still able to attend well.

## 2020-11-02 ENCOUNTER — Other Ambulatory Visit: Payer: Self-pay

## 2020-11-02 ENCOUNTER — Ambulatory Visit: Payer: No Typology Code available for payment source

## 2020-11-02 DIAGNOSIS — R41841 Cognitive communication deficit: Secondary | ICD-10-CM | POA: Diagnosis not present

## 2020-11-02 NOTE — Therapy (Signed)
[facility] [address] [place], [place], [address] Phone: [phone]   Fax:  [phone]  Speech Language Pathology Treatment  Patient Details  Name: Kellie Kline MRN: [id number] Date of Birth: 2000/10/11 Referring Provider (SLP): Renaye Rakers, MD   Encounter Date: 11/02/2020   End of Session - 11/02/20 1657     Visit Number 4    Number of Visits 9    Date for SLP Re-Evaluation 12/07/20    SLP Start Time 1700    SLP Stop Time  1745    SLP Time Calculation (min) 45 min    Activity Tolerance Patient tolerated treatment well             History reviewed. No pertinent past medical history.  History reviewed. No pertinent surgical history.  There were no vitals filed for this visit.   Subjective Assessment - 11/02/20 1659     Subjective "I just got off work"    Currently in Pain? No/denies                   ADULT SLP TREATMENT - 11/02/20 1657       General Information   Behavior/Cognition Alert;Cooperative;Pleasant mood      Treatment Provided   Treatment provided Cognitive-Linquistic      Cognitive-Linquistic Treatment   Treatment focused on Cognition;Patient/family/caregiver education    Skilled Treatment Pt did not complete HEP as recommended. SLP reiterated to patient the importance of completing HEP to maximize outcomes and benefits of ST intervention. Pt verbalized understanding. SLP targeted alternating attention tasks with focus on attention to details. Background noise present during tasks. Pt completed task with 88% accuracy given self-corrections x3 during review of task. SLP educated patient on importance of confirming/clarifying comprehension, attention to detail, and double checking work. SLP targeted visual recall of pictures, in which pt completed with 78% accuracy. Pt reports she would probably benefit from additional time, in which SLP recommended pt advocate for self in situations  where she needs more time to process. Absence or presence of background noise did not appear to impact functioning on visual recall task.      Assessment / Recommendations / Plan   Plan Continue with current plan of care      Progression Toward Goals   Progression toward goals Progressing toward goals              SLP Education - 11/02/20 1709     Education Details HEP, ST recommendations    Person(s) Educated Patient    Methods Demonstration;Handout;Explanation    Comprehension Verbalized understanding;Returned demonstration;Need further instruction              SLP Short Term Goals - 11/02/20 1658       SLP SHORT TERM GOAL #1   Title Pt will utilize 3 memory/attention compensations to improve performance at work/school with occasional min A over 2 sessions    Time 2    Period Weeks    Status On-going      SLP SHORT TERM GOAL #2   Title Pt will answer (yes/no/WH-) questions regarding paragraph length information with 80% accuracy given occasional cues over 2 sessions    Time 2    Period Weeks    Status On-going      SLP SHORT TERM GOAL #3   Title Pt will comprehend multi-step directions to successfully complete mod complex tasks with rare min A over 2 sessions    Baseline --  Time 2    Period Weeks    Status On-going      SLP SHORT TERM GOAL #4   Title Pt will comprehend 10+ minute mod complex conversation with less than 3 repetitions over 2 sessions    Baseline 11-02-20    Time 2    Period Weeks    Status On-going      SLP SHORT TERM GOAL #5   Title Pt will complete cognitive PROM in first 1-2 sessions    Baseline Cog Function- SF: 23    Status Achieved              SLP Long Term Goals - 11/02/20 1658       SLP LONG TERM GOAL #1   Title Pt will utilize 5 memory/attention compensations to improve performance at work/school with rare min A over 2 sessions    Time 6    Period Weeks    Status On-going      SLP LONG TERM GOAL #2   Title Pt  will answer (yes/no/WH-) questions regarding multi-paragraph length information with 80% accuracy given rare cues over 2 sessions    Time 6    Period Weeks    Status On-going      SLP LONG TERM GOAL #3   Title Pt will comprehend 20+ minute mod complex conversation with less than 3 repetitions over 2 sessions    Time 6    Period Weeks    Status On-going      SLP LONG TERM GOAL #4   Title Pt will indicate improved cognitive functioning via PROM by 2 points at last ST session    Time 6    Period Weeks    Status On-going              Plan - 11/02/20 1658     Clinical Impression Statement Aniesha presents for OPST intervention for cognitive changes. SLP targeted structured attention and memory tasks in presence/absence of background noise. SLP reviewed recommendation for HEP and for pt to slowly incorporate background noise into daily tasks to optimize attention and focus skills. See "skilled treatment" for additional details of today's session. Given the impact of her current cognitive linguistic functioning on school and work performance, SLP recommends skilled ST intervention to address mild cognitive lingusitic deficits to optimize overall cognitive functioning and maximize functional independence.    Speech Therapy Frequency 1x /week    Duration 8 weeks    Treatment/Interventions Compensatory strategies;Functional tasks;Cueing hierarchy;Cognitive reorganization;Language facilitation;Compensatory techniques;Internal/external aids;SLP instruction and feedback    Potential to Achieve Goals Fair    Potential Considerations Previous level of function;Cooperation/participation level    SLP Home Exercise Plan provided    Consulted and Agree with Plan of Care Patient             Patient will benefit from skilled therapeutic intervention in order to improve the following deficits and impairments:   Cognitive communication deficit    Problem List There are no problems to display for  this patient.   Janann Colonel, MA CCC-SLP 11/02/2020, 5:54 PM  [facility] [address] [place], [place], [address] Phone: [phone]   Fax:  [phone]   Name: Kellie Kline MRN: [id number] Date of Birth: [birth year]

## 2020-11-09 ENCOUNTER — Ambulatory Visit: Payer: No Typology Code available for payment source

## 2020-12-28 ENCOUNTER — Other Ambulatory Visit: Payer: Self-pay | Admitting: Family Medicine

## 2020-12-28 DIAGNOSIS — R000 Tachycardia, unspecified: Secondary | ICD-10-CM

## 2021-01-04 ENCOUNTER — Other Ambulatory Visit: Payer: Self-pay

## 2021-01-04 ENCOUNTER — Inpatient Hospital Stay: Payer: No Typology Code available for payment source

## 2021-01-04 DIAGNOSIS — R000 Tachycardia, unspecified: Secondary | ICD-10-CM

## 2021-01-15 NOTE — Therapy (Signed)
[facility] [address] [place], [place], [address] Phone: [phone]   Fax:  [phone]  Patient Details  Name: Kellie Kline MRN: [id number] Date of Birth: Sep 05, 2000 Referring Provider:  Lucianne Lei, MD  Encounter Date: 01/15/2021  SPEECH THERAPY DISCHARGE SUMMARY  Visits from Start of Care: 4  Current functional level related to goals / functional outcomes: Pt did not return for ST intervention. Pt exhibited improved auditory comprehension with additional time, occasional repetition, and decrease in environmental distractions.    Remaining deficits: Unknown   Education / Equipment: Attention and comprehension strategies, functional application    Patient agrees to discharge. Patient goals were not met. Patient is being discharged due to not returning since the last visit.Marland Kitchen     SLP Short Term Goals - 10/20/20 1736                SLP SHORT TERM GOAL #1    Title Pt will utilize 3 memory/attention compensations to improve performance at work/school with occasional min A over 2 sessions     Time 4     Period Weeks     Status On-going          SLP SHORT TERM GOAL #2    Title Pt will answer (yes/no/WH-) questions regarding paragraph length information with 80% accuracy given occasional cues over 2 sessions     Time 4     Period Weeks     Status On-going          SLP SHORT TERM GOAL #3    Title Pt will comprehend multi-step directions to successfully complete mod complex tasks with rare min A over 2 sessions     Time 4     Period Weeks     Status On-going          SLP SHORT TERM GOAL #4    Title Pt will comprehend 10+ minute mod complex conversation with less than 3 repetitions over 2 sessions     Time 4     Period Weeks     Status On-going          SLP SHORT TERM GOAL #5    Title Pt will complete cognitive PROM in first 1-2 sessions     Baseline Cog Function- SF: 23     Status Achieved                      SLP Long Term Goals - 10/20/20 1736                SLP LONG TERM GOAL #1    Title Pt will utilize 5 memory/attention compensations to improve performance at work/school with rare min A over 2 sessions     Time 8     Period Weeks     Status On-going          SLP LONG TERM GOAL #2    Title Pt will answer (yes/no/WH-) questions regarding multi-paragraph length information with 80% accuracy given rare cues over 2 sessions     Time 8     Period Weeks     Status On-going          SLP LONG TERM GOAL #3    Title Pt will comprehend 20+ minute mod complex conversation with less than 3 repetitions over 2 sessions     Time 8     Period Weeks     Status On-going  SLP LONG TERM GOAL #4    Title Pt will indicate improved cognitive functioning via PROM by 2 points at last ST session     Time 8     Period Weeks     Status On-going            Alinda Deem, MA CCC-SLP 01/15/2021, 8:41 AM  [facility] [address] [place], [place], [address] Phone: [phone]   Fax:  [phone]

## 2022-04-19 DIAGNOSIS — F900 Attention-deficit hyperactivity disorder, predominantly inattentive type: Secondary | ICD-10-CM | POA: Diagnosis not present

## 2022-04-19 DIAGNOSIS — F411 Generalized anxiety disorder: Secondary | ICD-10-CM | POA: Diagnosis not present

## 2022-04-19 DIAGNOSIS — F331 Major depressive disorder, recurrent, moderate: Secondary | ICD-10-CM | POA: Diagnosis not present

## 2022-04-22 DIAGNOSIS — F319 Bipolar disorder, unspecified: Secondary | ICD-10-CM | POA: Diagnosis not present

## 2022-04-22 DIAGNOSIS — Z0000 Encounter for general adult medical examination without abnormal findings: Secondary | ICD-10-CM | POA: Diagnosis not present

## 2022-04-22 DIAGNOSIS — N39498 Other specified urinary incontinence: Secondary | ICD-10-CM | POA: Diagnosis not present

## 2022-05-05 DIAGNOSIS — F331 Major depressive disorder, recurrent, moderate: Secondary | ICD-10-CM | POA: Diagnosis not present

## 2022-05-05 DIAGNOSIS — F900 Attention-deficit hyperactivity disorder, predominantly inattentive type: Secondary | ICD-10-CM | POA: Diagnosis not present

## 2022-05-05 DIAGNOSIS — F411 Generalized anxiety disorder: Secondary | ICD-10-CM | POA: Diagnosis not present

## 2022-05-14 ENCOUNTER — Encounter (HOSPITAL_COMMUNITY): Payer: Self-pay | Admitting: Psychiatry

## 2022-05-14 ENCOUNTER — Ambulatory Visit (HOSPITAL_BASED_OUTPATIENT_CLINIC_OR_DEPARTMENT_OTHER): Payer: 59 | Admitting: Psychiatry

## 2022-05-14 DIAGNOSIS — R5383 Other fatigue: Secondary | ICD-10-CM

## 2022-05-14 DIAGNOSIS — F411 Generalized anxiety disorder: Secondary | ICD-10-CM | POA: Diagnosis not present

## 2022-05-14 DIAGNOSIS — F3130 Bipolar disorder, current episode depressed, mild or moderate severity, unspecified: Secondary | ICD-10-CM

## 2022-05-14 DIAGNOSIS — F331 Major depressive disorder, recurrent, moderate: Secondary | ICD-10-CM

## 2022-05-14 NOTE — Progress Notes (Signed)
Psychiatric Initial Adult Assessment   Patient Identification: Kellie Kline MRN:  [id number] Date of Evaluation:  05/14/2022 Referral Source: primary care Chief Complaint:   Chief Complaint  Patient presents with   Depression   Establish Care  Virtual Visit via Video Note  I connected with Kellie Kline on 05/14/22 at  8:00 AM EST by a video enabled telemedicine application and verified that I am speaking with the correct person using two identifiers.  Location: Patient: home Provider: home office   I discussed the limitations of evaluation and management by telemedicine and the availability of in person appointments. The patient expressed understanding and agreed to proceed.     I discussed the assessment and treatment plan with the patient. The patient was provided an opportunity to ask questions and all were answered. The patient agreed with the plan and demonstrated an understanding of the instructions.   The patient was advised to call back or seek an in-person evaluation if the symptoms worsen or if the condition fails to improve as anticipated.  I provided 45 minutes of non-face-to-face time during this encounter.   Visit Diagnosis: No diagnosis found. 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  States diagnosed with depression, bipolar and anxiety symptoms also past history of ADHD  Patient states has suffered from depression since starting age 80 with feeling of despair depression, sadness and diagnosed with depression relevant to having interference with her biological parents while growing up there is no  associated psychotic-like symptoms or past suicide attempt or admission.  She states that she has had suffered from anxiety excessive worries worries related to her when she was growing up and interference with her biological parents while growing up.  She has been reasonably maintained on her medication including Abilify and Lexapro there has been weight increased gradually over the last 3 to 4 years.  Otherwise her sleep is adequate does not endorse as of now any hopelessness and a day-to-day depressive symptoms.  She has been diagnosed with bipolar at [facility] but there is no clear manic history that I can elaborate she does have some days of increased spending or energy but she describes this to be as a outlook from depression.  There is no associated psychotic symptoms or otherwise irrational behaviors excessive energy and risk-taking activities  She has still seen of provider at [facility] yesterday but she wants to change because of insurance reason as of now she does have the medication there is no reported side effects.  Sometimes she notices some lip movements but she does not want to be added on any medication and will keep it under observation otherwise no other involuntary movements noticeable or described  Aggravating factors: biological parents interference when young, some job stress Modifying factor; mom, sister  Duration since young age  Severity is manageable Hospital admission denies Suicide attempt denies Drug use denies   Past Psychiatric History: depression, anxiety  Previous Psychotropic Medications: No   Substance Abuse History in the last 12 months:  No.  Consequences of Substance Abuse: NA  Past Medical History: History reviewed. No pertinent past medical history. History  reviewed. No pertinent surgical history.  Family Psychiatric History: biological parents not sure, maybe grand ma had some sickness  Family History: History  reviewed. No pertinent family history.  Social History:   Social History   Socioeconomic History   Marital status: Single    Spouse name: Not on file   Number of children: Not on file   Years of education: Not on file   Highest education level: Not on file  Occupational History   Not on file  Tobacco Use   Smoking status: Never   Smokeless tobacco: Never  Vaping Use   Vaping Use: Never used  Substance and Sexual Activity   Alcohol use: No   Drug use: No   Sexual activity: Never  Other Topics Concern   Not on file  Social History Narrative   Not on file   Social Determinants of Health   Financial Resource Strain: Not on file  Food Insecurity: Not on file  Transportation Needs: Not on file  Physical Activity: Not on file  Stress: Not on file  Social Connections: Not on file    Additional Social History: grew up with adapted parents, would get upset when biological parents would come in her life and control, emotional concerns  Allergies:   Allergies  Allergen Reactions   Lactose Intolerance (Gi)     Metabolic Disorder Labs: No results found for: "HGBA1C", "MPG" No results found for: "PROLACTIN" No results found for: "CHOL", "TRIG", "HDL", "CHOLHDL", "VLDL", "LDLCALC" No results found for: "TSH"  Therapeutic Level Labs: No results found for: "LITHIUM" No results found for: "CBMZ" No results found for: "VALPROATE"  Current Medications: Current Outpatient Medications  Medication Sig Dispense Refill   ARIPiprazole (ABILIFY) 5 MG tablet Take 5 mg by mouth daily.     dicyclomine (BENTYL) 20 MG tablet Take 1 tablet (20 mg total) by mouth 2 (two) times daily. (Patient not taking: Reported on 01/18/2020) 20 tablet 0   escitalopram (LEXAPRO) 20 MG tablet Take 20 mg by mouth at bedtime.      loratadine (CLARITIN) 10 MG tablet Take 1 tablet (10 mg total) by mouth daily. (Patient not taking: Reported on 10/12/2020) 30 tablet 0   ondansetron (ZOFRAN ODT) 4 MG  disintegrating tablet Take 1 tablet (4 mg total) by mouth every 8 (eight) hours as needed for nausea or vomiting. 20 tablet 0   sodium chloride (OCEAN NASAL SPRAY) 0.65 % nasal spray Place 1 spray into the nose as needed for congestion. (Patient not taking: Reported on 10/27/2017) 30 mL 12   Vitamin D, Ergocalciferol, (DRISDOL) 1.25 MG (50000 UNIT) CAPS capsule Take 50,000 Units by mouth once a week.     No current facility-administered medications for this visit.    Psychiatric Specialty Exam: Review of Systems  Cardiovascular:  Negative for chest pain.  Psychiatric/Behavioral:  Negative for agitation, dysphoric mood and sleep disturbance.     There were no vitals taken for this visit.There is no height or weight on file to calculate BMI.  General Appearance: Casual  Eye Contact:  Fair  Speech:  Clear and Coherent  Volume:  Normal  Mood:  Euthymic  Affect:  Constricted  Thought Process:  Goal Directed  Orientation:  Full (Time, Place, and Person)  Thought Content:  Logical  Suicidal Thoughts:  No  Homicidal Thoughts:  No  Memory:  Immediate;   Fair  Judgement:  Fair  Insight:  Fair  Psychomotor Activity:  Normal  Concentration:  Concentration: Fair  Recall:  [place] of Knowledge:Good  Language: Good  Akathisia:  No  Handed:    AIMS (if indicated):  no involuntary movement  Assets:  Desire for Improvement Housing  ADL's:  Intact  Cognition: WNL  Sleep:  [place] Office Visit from 05/14/2022 in [facility] from [facility]/18/2020 in [facility] at [facility]  PHQ-2 Total Score 1 2  PHQ-9 Total Score -- 6      [place] Office Visit from 05/14/2022 in [facility] from 01/18/2020 in [facility] Emergency Department at [place] No Risk Error: Q2 is Yes, you must answer 3, 4, and 5        Assessment and Plan: as follows  MDD recurrent moderate: in near remission continue abilify and lexapro Discussed if change to prozac for weight neutral  , she will review and let us know No clear history of manic episodes, will continue abilify as mood stabilizer and or for depression augmentation as it is helping; Questions addressed. Meds reviewed Has meds for now GAD: continue lexapro , may consider change if needed  Tiredness: rule out sleep apnea, she pans to review and if need refer to get sleep study Also may be contriubting symptoms of inattention Reviewed sleep wake cycle Follow up 1 months  Collaboration of Care: Primary Care Provider AEB reviewed notes and referral  Patient/Guardian was advised Release of Information must be obtained prior to any record release in order to collaborate their care with an outside provider. Patient/Guardian was advised if they have not already done so to contact the registration department to sign all necessary forms in order for [place] to release information regarding their care.   Consent: Patient/Guardian gives verbal consent for treatment and assignment of benefits for services provided during this visit. Patient/Guardian expressed understanding and agreed to proceed.   Merian Capron, MD 1/27/20248:16 AM

## 2022-05-19 DIAGNOSIS — F900 Attention-deficit hyperactivity disorder, predominantly inattentive type: Secondary | ICD-10-CM | POA: Diagnosis not present

## 2022-05-19 DIAGNOSIS — F331 Major depressive disorder, recurrent, moderate: Secondary | ICD-10-CM | POA: Diagnosis not present

## 2022-05-19 DIAGNOSIS — F411 Generalized anxiety disorder: Secondary | ICD-10-CM | POA: Diagnosis not present

## 2022-05-24 DIAGNOSIS — E611 Iron deficiency: Secondary | ICD-10-CM | POA: Diagnosis not present

## 2022-05-24 DIAGNOSIS — R051 Acute cough: Secondary | ICD-10-CM | POA: Diagnosis not present

## 2022-05-24 DIAGNOSIS — Z20822 Contact with and (suspected) exposure to covid-19: Secondary | ICD-10-CM | POA: Diagnosis not present

## 2022-05-24 DIAGNOSIS — J029 Acute pharyngitis, unspecified: Secondary | ICD-10-CM | POA: Diagnosis not present

## 2022-05-24 DIAGNOSIS — Z79899 Other long term (current) drug therapy: Secondary | ICD-10-CM | POA: Diagnosis not present

## 2022-06-16 DIAGNOSIS — F900 Attention-deficit hyperactivity disorder, predominantly inattentive type: Secondary | ICD-10-CM | POA: Diagnosis not present

## 2022-06-16 DIAGNOSIS — F411 Generalized anxiety disorder: Secondary | ICD-10-CM | POA: Diagnosis not present

## 2022-06-16 DIAGNOSIS — F331 Major depressive disorder, recurrent, moderate: Secondary | ICD-10-CM | POA: Diagnosis not present

## 2022-06-17 ENCOUNTER — Telehealth (INDEPENDENT_AMBULATORY_CARE_PROVIDER_SITE_OTHER): Payer: 59 | Admitting: Psychiatry

## 2022-06-17 ENCOUNTER — Encounter (HOSPITAL_COMMUNITY): Payer: Self-pay | Admitting: Psychiatry

## 2022-06-17 DIAGNOSIS — F331 Major depressive disorder, recurrent, moderate: Secondary | ICD-10-CM | POA: Diagnosis not present

## 2022-06-17 DIAGNOSIS — R5383 Other fatigue: Secondary | ICD-10-CM

## 2022-06-17 DIAGNOSIS — F411 Generalized anxiety disorder: Secondary | ICD-10-CM | POA: Diagnosis not present

## 2022-06-17 MED ORDER — ARIPIPRAZOLE 5 MG PO TABS: 5.0000 mg | ORAL_TABLET | Freq: Every day | ORAL | 1 refills | Status: DC

## 2022-06-17 MED ORDER — ESCITALOPRAM OXALATE 20 MG PO TABS: 20.0000 mg | ORAL_TABLET | Freq: Every day | ORAL | 1 refills | Status: DC

## 2022-06-17 NOTE — Progress Notes (Signed)
[facility] Follow up visit  Patient Identification: Kellie Kline MRN:  [id number] Date of Evaluation:  06/17/2022 Referral Source: primary care Chief Complaint:   No chief complaint on file.  Follow up depression, anxiety   Visit Diagnosis:    ICD-10-CM   1. MDD (major depressive disorder), recurrent episode, moderate (HCC)  F33.1     2. GAD (generalized anxiety disorder)  F41.1     3. Tiredness  R53.83      MDD recurrent, rule out bipolar depressed GAD,  Tiredness  Virtual Visit via Video Note  I connected with Bernadetta Ransier on 06/17/22 at 12:00 PM EST by a video enabled telemedicine application and verified that I am speaking with the correct person using two identifiers.  Location: Patient: home Provider: home office   I discussed the limitations of evaluation and management by telemedicine and the availability of in person appointments. The patient expressed understanding and agreed to proceed.     I discussed the assessment and treatment plan with the patient. The patient was provided an opportunity to ask questions and all were answered. The patient agreed with the plan and demonstrated an understanding of the instructions.   The patient was advised to call back or seek an in-person evaluation if the symptoms worsen or if the condition fails to improve as anticipated.  I provided 20 minutes of non-face-to-face time during this encounter.   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  States diagnosed with depression, bipolar and anxiety symptoms also past history of ADHD  Patient states has suffered from depression since starting age 22 with feeling of despair  depression, sadness and diagnosed with depression relevant to having interference with her biological parents while growing up there is no associated psychotic-like symptoms or past suicide attempt or admission.  She states that she has had suffered from anxiety excessive worries worries related to her when she was growing up and interference with her biological parents while growing up.   Last visit kept on lexapro and abilify, has moved services from [facility]  Doing fair,  Managing job and stressors  No clear manic history  Aggravating factors: biological parents interference when young, some job stress Modifying factor; mom, sister  Duration since young age  Severity improved Hospital admission denies Suicide attempt denies Drug use denies   Past Psychiatric History: depression, anxiety  Previous Psychotropic Medications: No   Substance Abuse History in the last 12 months:  No.  Consequences of Substance Abuse: NA  Past Medical History: History reviewed. No pertinent past medical history. History reviewed. No pertinent surgical history.  Family Psychiatric History: biological parents not sure, maybe grand ma had some sickness  Family History: History reviewed. No pertinent family history.  Social History:   Social History   Socioeconomic History   Marital status: Single    Spouse name: Not on file   Number of children: Not on file   Years of education: Not on file   Highest education level: Not on file  Occupational History   Not on file  Tobacco Use   Smoking status: Never   Smokeless tobacco: Never  Vaping Use   Vaping Use: Never used  Substance  and Sexual Activity   Alcohol use: No   Drug use: No   Sexual activity: Never  Other Topics Concern   Not on file  Social History Narrative   Not on file   Social Determinants of Health   Financial Resource Strain: Not on file  Food Insecurity: Not on file  Transportation Needs: Not on file   Physical Activity: Not on file  Stress: Not on file  Social Connections: Not on file    Additional Social History: grew up with adapted parents, would get upset when biological parents would come in her life and control, emotional concerns  Allergies:   Allergies  Allergen Reactions   Lactose Intolerance (Gi)     Metabolic Disorder Labs: No results found for: "HGBA1C", "MPG" No results found for: "PROLACTIN" No results found for: "CHOL", "TRIG", "HDL", "CHOLHDL", "VLDL", "LDLCALC" No results found for: "TSH"  Therapeutic Level Labs: No results found for: "LITHIUM" No results found for: "CBMZ" No results found for: "VALPROATE"  Current Medications: Current Outpatient Medications  Medication Sig Dispense Refill   ARIPiprazole (ABILIFY) 5 MG tablet Take 1 tablet (5 mg total) by mouth daily. 30 tablet 1   dicyclomine (BENTYL) 20 MG tablet Take 1 tablet (20 mg total) by mouth 2 (two) times daily. (Patient not taking: Reported on 01/18/2020) 20 tablet 0   escitalopram (LEXAPRO) 20 MG tablet Take 1 tablet (20 mg total) by mouth at bedtime. 30 tablet 1   loratadine (CLARITIN) 10 MG tablet Take 1 tablet (10 mg total) by mouth daily. (Patient not taking: Reported on 10/12/2020) 30 tablet 0   ondansetron (ZOFRAN ODT) 4 MG disintegrating tablet Take 1 tablet (4 mg total) by mouth every 8 (eight) hours as needed for nausea or vomiting. 20 tablet 0   sodium chloride (OCEAN NASAL SPRAY) 0.65 % nasal spray Place 1 spray into the nose as needed for congestion. (Patient not taking: Reported on 10/27/2017) 30 mL 12   Vitamin D, Ergocalciferol, (DRISDOL) 1.25 MG (50000 UNIT) CAPS capsule Take 50,000 Units by mouth once a week.     No current facility-administered medications for this visit.    Psychiatric Specialty Exam: Review of Systems  Cardiovascular:  Negative for chest pain.  Psychiatric/Behavioral:  Negative for agitation, dysphoric mood and sleep disturbance.     There were no vitals  taken for this visit.There is no height or weight on file to calculate BMI.  General Appearance: Casual  Eye Contact:  Fair  Speech:  Clear and Coherent  Volume:  Normal  Mood:  Euthymic  Affect:  Constricted  Thought Process:  Goal Directed  Orientation:  Full (Time, Place, and Person)  Thought Content:  Logical  Suicidal Thoughts:  No  Homicidal Thoughts:  No  Memory:  Immediate;   Fair  Judgement:  Fair  Insight:  Fair  Psychomotor Activity:  Normal  Concentration:  Concentration: Fair  Recall:  [place] of Knowledge:Good  Language: Good  Akathisia:  No  Handed:    AIMS (if indicated):  no involuntary movement  Assets:  Desire for Improvement Housing  ADL's:  Intact  Cognition: WNL  Sleep:  Fair   Screenings: [place] Office Visit from 05/14/2022 in [facility] from 01/18/2020 in [facility] Emergency Department [facility]  PHQ-2 Total Score 1 2  PHQ-9 Total Score -- [place] Office Visit from 05/14/2022 in [facility] from 01/18/2020 in [facility] Emergency Department at [place] No Risk Error: Q2 is Yes, you must answer 3, 4, and 5       Assessment and Plan: as follows  Prior documentation reviewed  MDD recurrent moderate: in near remission ; doing same and not worse, continue meds  No clear history of manic episodes,  GAD: manageable continue lexapro   Tiredness: not worse, understands to get sleep study done If needed and balance sleep wake hours Fu 78m   Collaboration of Care: Primary Care Provider AEB reviewed notes and referral  Patient/Guardian was advised Release of Information must be obtained prior to any record release in order to collaborate their care with an outside provider. Patient/Guardian was advised if they have not already done so to contact the registration department to sign  all necessary forms in order for [place] release information regarding their care.   Consent: Patient/Guardian gives verbal consent for treatment and assignment of benefits for services provided during this visit. Patient/Guardian expressed understanding and agreed to proceed.   NMerian Capron MD 3/1/202412:10 PM

## 2022-07-30 ENCOUNTER — Other Ambulatory Visit (HOSPITAL_COMMUNITY): Payer: Self-pay

## 2022-07-30 MED ORDER — ARIPIPRAZOLE 5 MG PO TABS
5.0000 mg | ORAL_TABLET | Freq: Every day | ORAL | 5 refills | Status: DC
  Filled 2022-07-30: qty 30, 30d supply, fill #0
  Filled 2022-08-24: qty 30, 30d supply, fill #1
  Filled 2022-09-24 (×2): qty 30, 30d supply, fill #2

## 2022-08-19 ENCOUNTER — Other Ambulatory Visit: Payer: Self-pay

## 2022-08-19 ENCOUNTER — Other Ambulatory Visit (HOSPITAL_COMMUNITY): Payer: Self-pay

## 2022-08-19 DIAGNOSIS — L2089 Other atopic dermatitis: Secondary | ICD-10-CM | POA: Diagnosis not present

## 2022-08-19 DIAGNOSIS — L83 Acanthosis nigricans: Secondary | ICD-10-CM | POA: Diagnosis not present

## 2022-08-19 MED ORDER — BETAMETHASONE DIPROPIONATE AUG 0.05 % EX OINT
TOPICAL_OINTMENT | CUTANEOUS | 3 refills | Status: AC
  Filled 2022-08-19: qty 50, 30d supply, fill #0

## 2022-08-19 MED ORDER — DOXYCYCLINE HYCLATE 100 MG PO CAPS
100.0000 mg | ORAL_CAPSULE | Freq: Two times a day (BID) | ORAL | 0 refills | Status: DC
  Filled 2022-08-19: qty 20, 10d supply, fill #0

## 2022-08-19 MED ORDER — TACROLIMUS 0.1 % EX OINT
TOPICAL_OINTMENT | CUTANEOUS | 11 refills | Status: AC
  Filled 2022-08-19: qty 30, 30d supply, fill #0
  Filled 2022-09-24: qty 60, 30d supply, fill #1
  Filled 2022-09-24 (×2): qty 30, 30d supply, fill #1
  Filled 2022-10-06: qty 30, 15d supply, fill #1

## 2022-08-22 DIAGNOSIS — L83 Acanthosis nigricans: Secondary | ICD-10-CM | POA: Diagnosis not present

## 2022-08-22 DIAGNOSIS — E6609 Other obesity due to excess calories: Secondary | ICD-10-CM | POA: Diagnosis not present

## 2022-08-22 DIAGNOSIS — Z6833 Body mass index (BMI) 33.0-33.9, adult: Secondary | ICD-10-CM | POA: Diagnosis not present

## 2022-08-22 DIAGNOSIS — E611 Iron deficiency: Secondary | ICD-10-CM | POA: Diagnosis not present

## 2022-08-22 DIAGNOSIS — Z1329 Encounter for screening for other suspected endocrine disorder: Secondary | ICD-10-CM | POA: Diagnosis not present

## 2022-08-31 DIAGNOSIS — E669 Obesity, unspecified: Secondary | ICD-10-CM | POA: Diagnosis not present

## 2022-09-08 ENCOUNTER — Other Ambulatory Visit (HOSPITAL_COMMUNITY): Payer: Self-pay

## 2022-09-24 ENCOUNTER — Other Ambulatory Visit (HOSPITAL_COMMUNITY): Payer: Self-pay

## 2022-09-26 ENCOUNTER — Telehealth (INDEPENDENT_AMBULATORY_CARE_PROVIDER_SITE_OTHER): Payer: 59 | Admitting: Psychiatry

## 2022-09-26 ENCOUNTER — Other Ambulatory Visit: Payer: Self-pay

## 2022-09-26 ENCOUNTER — Other Ambulatory Visit (HOSPITAL_COMMUNITY): Payer: Self-pay

## 2022-09-26 ENCOUNTER — Encounter (HOSPITAL_COMMUNITY): Payer: Self-pay | Admitting: Psychiatry

## 2022-09-26 DIAGNOSIS — F3130 Bipolar disorder, current episode depressed, mild or moderate severity, unspecified: Secondary | ICD-10-CM | POA: Diagnosis not present

## 2022-09-26 DIAGNOSIS — R5383 Other fatigue: Secondary | ICD-10-CM

## 2022-09-26 DIAGNOSIS — F411 Generalized anxiety disorder: Secondary | ICD-10-CM | POA: Diagnosis not present

## 2022-09-26 DIAGNOSIS — F331 Major depressive disorder, recurrent, moderate: Secondary | ICD-10-CM

## 2022-09-26 MED ORDER — ARIPIPRAZOLE 5 MG PO TABS
5.0000 mg | ORAL_TABLET | Freq: Every day | ORAL | 2 refills | Status: DC
  Filled 2022-09-26 – 2022-11-07 (×4): qty 30, 30d supply, fill #0
  Filled 2022-12-03: qty 30, 30d supply, fill #1

## 2022-09-26 MED ORDER — ESCITALOPRAM OXALATE 20 MG PO TABS
20.0000 mg | ORAL_TABLET | Freq: Every day | ORAL | 2 refills | Status: DC
  Filled 2022-09-26 – 2022-10-06 (×2): qty 30, 30d supply, fill #0

## 2022-09-26 NOTE — Progress Notes (Signed)
[facility] Follow up visit  Patient Identification: Kellie Kline MRN:  [id number] Date of Evaluation:  09/26/2022 Referral Source: primary care Chief Complaint:   No chief complaint on file.  Follow up depression, anxiety   Visit Diagnosis:    ICD-10-CM   1. Bipolar I disorder, most recent episode depressed (HCC)  F31.30     2. GAD (generalized anxiety disorder)  F41.1     3. MDD (major depressive disorder), recurrent episode, moderate (HCC)  F33.1     4. Tiredness  R53.83      MDD recurrent, rule out bipolar depressed GAD,  Tiredness  Virtual Visit via Video Note  I connected with Kellie Kline on 09/26/22 at  1:00 PM EDT by a video enabled telemedicine application and verified that I am speaking with the correct person using two identifiers.  Location: Patient: work/home Provider: home office   I discussed the limitations of evaluation and management by telemedicine and the availability of in person appointments. The patient expressed understanding and agreed to proceed.     I discussed the assessment and treatment plan with the patient. The patient was provided an opportunity to ask questions and all were answered. The patient agreed with the plan and demonstrated an understanding of the instructions.   The patient was advised to call back or seek an in-person evaluation if the symptoms worsen or if the condition fails to improve as anticipated.  I provided 20 minutes of non-face-to-face time during this encounter.      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    Overall depression is manageable and anxiety as well. Tries to keep balance in life, mom and sister lives together and god support   No tremors Managing job  and stressors  No clear manic history  Aggravating factors: biological parents interference when young, some job stress Modifying factor; mom, sister  Duration since young age  Severity better Hospital admission denies Suicide attempt denies Drug use denies   Past Psychiatric History: depression, anxiety  Previous Psychotropic Medications: No   Substance Abuse History in the last 12 months:  No.  Consequences of Substance Abuse: NA  Past Medical History: History reviewed. No pertinent past medical history. History reviewed. No pertinent surgical history.  Family Psychiatric History: biological parents not sure, maybe grand ma had some sickness  Family History: History reviewed. No pertinent family history.  Social History:   Social History   Socioeconomic History   Marital status: Single    Spouse name: Not on file   Number of children: Not on file   Years of education: Not on file   Highest education level: Not on file  Occupational History   Not on file  Tobacco Use   Smoking status: Never   Smokeless tobacco: Never  Vaping Use   Vaping Use: Never used  Substance and Sexual Activity   Alcohol use: No   Drug use: No   Sexual activity: Never  Other Topics Concern   Not on file  Social History Narrative   Not on file   Social Determinants of Health   Financial Resource Strain: Not on file  Food Insecurity: Not on file  Transportation Needs: Not on file  Physical Activity: Not on file  Stress: Not on file  Social Connections: Not on file    Additional Social History: grew up with adapted parents, would get upset when biological parents would come in her life and control, emotional concerns  Allergies:   Allergies  Allergen Reactions   Lactose Intolerance (Gi)     Metabolic Disorder Labs: No results found for: "HGBA1C", "MPG" No results found for: "PROLACTIN" No results found for: "CHOL", "TRIG", "HDL", "CHOLHDL", "VLDL", "LDLCALC" No results  found for: "TSH"  Therapeutic Level Labs: No results found for: "LITHIUM" No results found for: "CBMZ" No results found for: "VALPROATE"  Current Medications: Current Outpatient Medications  Medication Sig Dispense Refill   ARIPiprazole (ABILIFY) 5 MG tablet Take 1 tablet (5 mg total) by mouth daily. 30 tablet 2   augmented betamethasone dipropionate (DIPROLENE-AF) 0.05 % ointment Apply a small amount to skin twice a day 50 g 3   dicyclomine (BENTYL) 20 MG tablet Take 1 tablet (20 mg total) by mouth 2 (two) times daily. (Patient not taking: Reported on 01/18/2020) 20 tablet 0   doxycycline (VIBRAMYCIN) 100 MG capsule Take 1 capsule (100 mg total) by mouth 2 (two) times daily with food 20 capsule 0   escitalopram (LEXAPRO) 20 MG tablet Take 1 tablet (20 mg total) by mouth at bedtime. 30 tablet 2   loratadine (CLARITIN) 10 MG tablet Take 1 tablet (10 mg total) by mouth daily. (Patient not taking: Reported on 10/12/2020) 30 tablet 0   ondansetron (ZOFRAN ODT) 4 MG disintegrating tablet Take 1 tablet (4 mg total) by mouth every 8 (eight) hours as needed for nausea or vomiting. 20 tablet 0   sodium chloride (OCEAN NASAL SPRAY) 0.65 % nasal spray Place 1 spray into the nose as needed for congestion. (Patient not taking: Reported on 10/27/2017) 30 mL 12   tacrolimus (PROTOPIC) 0.1 % ointment Apply a small amount to affected area twice a day 30 g 11   Vitamin D, Ergocalciferol, (DRISDOL) 1.25 MG (50000 UNIT) CAPS capsule Take 50,000 Units by mouth once a week.     No current facility-administered medications for this visit.    Psychiatric Specialty Exam: Review of Systems  Cardiovascular:  Negative for chest pain.  Psychiatric/Behavioral:  Negative for agitation, dysphoric mood and sleep disturbance.     There were no vitals taken for this visit.There is no height or weight on file to calculate BMI.  General Appearance: Casual  Eye Contact:  Fair  Speech:  Clear and Coherent  Volume:  Normal   Mood:  Euthymic  Affect:  Constricted  Thought Process:  Goal Directed  Orientation:  Full (Time, Place, and Person)  Thought Content:  Logical  Suicidal Thoughts:  No  Homicidal Thoughts:  No  Memory:  Immediate;   Fair  Judgement:  Fair  Insight:  Fair  Psychomotor Activity:  Normal  Concentration:  Concentration: Fair  Recall:  Fair  Fund of Knowledge:Good  Language: Good  Akathisia:  No  Handed:    AIMS (if indicated):  no involuntary movement  Assets:  Desire for Improvement Housing  ADL's:  Intact  Cognition: WNL  Sleep:  Fair   Screenings: PHQ2-9    [place] [facility] from 01/18/2020 in [facility] Emergency Department at [facility]  PHQ-2 Total Score 1 2  PHQ-9 Total Score -- 6      Flowsheet Row Office Visit from [facility]/14/[facility] from 01/18/2020 in [facility] Emergency Department  at [facility]  C-SSRS RISK CATEGORY No Risk Error: Q2 is Yes, you must answer 3, 4, and 5       Assessment and Plan: as follows  Prior documentation reviewed  MDD recurrent moderate: in near remission ;doing fair, continue meds, including lexapro   No clear history of manic episodes,  GAD: fair continue lexapro   Tiredness: did not voice concern, takes naps at times. Overall not worse Continue sleep hygiene and balance of work life.   Fu 73m.  Collaboration of Care: Primary Care Provider AEB reviewed notes and referral  Patient/Guardian was advised Release of Information must be obtained prior to any record release in order to collaborate their care with an outside provider. Patient/Guardian was advised if they have not already done so to contact the registration department to sign all necessary forms in order for [place] to release information regarding their care.   Consent: Patient/Guardian gives verbal consent for treatment  and assignment of benefits for services provided during this visit. Patient/Guardian expressed understanding and agreed to proceed.   Thresa Ross, MD 6/10/20241:08 PM

## 2022-10-03 ENCOUNTER — Other Ambulatory Visit (HOSPITAL_COMMUNITY): Payer: Self-pay

## 2022-10-05 ENCOUNTER — Other Ambulatory Visit (HOSPITAL_COMMUNITY): Payer: Self-pay

## 2022-10-06 ENCOUNTER — Other Ambulatory Visit: Payer: Self-pay

## 2022-10-06 ENCOUNTER — Other Ambulatory Visit (HOSPITAL_COMMUNITY): Payer: Self-pay

## 2022-10-06 MED ORDER — WEGOVY 0.25 MG/0.5ML ~~LOC~~ SOAJ
0.2500 mg | SUBCUTANEOUS | 1 refills | Status: DC
  Filled 2022-10-06: qty 2, 28d supply, fill #0

## 2022-11-06 ENCOUNTER — Other Ambulatory Visit (HOSPITAL_COMMUNITY): Payer: Self-pay

## 2022-11-07 ENCOUNTER — Other Ambulatory Visit (HOSPITAL_COMMUNITY): Payer: Self-pay

## 2022-11-07 ENCOUNTER — Other Ambulatory Visit: Payer: Self-pay

## 2022-11-07 ENCOUNTER — Other Ambulatory Visit: Payer: Self-pay | Admitting: Oncology

## 2022-11-07 DIAGNOSIS — Z006 Encounter for examination for normal comparison and control in clinical research program: Secondary | ICD-10-CM

## 2022-11-08 ENCOUNTER — Other Ambulatory Visit: Payer: Self-pay

## 2022-11-18 ENCOUNTER — Other Ambulatory Visit (HOSPITAL_COMMUNITY): Payer: Self-pay

## 2022-11-18 MED ORDER — METFORMIN HCL ER 500 MG PO TB24
500.0000 mg | ORAL_TABLET | Freq: Every day | ORAL | 1 refills | Status: DC
  Filled 2022-11-18: qty 90, 90d supply, fill #0

## 2022-11-18 MED ORDER — PHENTERMINE HCL 37.5 MG PO TABS
18.7500 mg | ORAL_TABLET | Freq: Every day | ORAL | 0 refills | Status: DC
  Filled 2022-11-18: qty 15, 30d supply, fill #0

## 2022-11-22 ENCOUNTER — Other Ambulatory Visit (HOSPITAL_COMMUNITY): Payer: Self-pay

## 2022-11-22 MED ORDER — TRULICITY 0.75 MG/0.5ML ~~LOC~~ SOAJ
SUBCUTANEOUS | 1 refills | Status: DC
  Filled 2022-11-22: qty 2, 28d supply, fill #0
  Filled 2022-12-13: qty 2, 28d supply, fill #1

## 2022-11-22 MED ORDER — VITAMIN D (ERGOCALCIFEROL) 1.25 MG (50000 UNIT) PO CAPS
50000.0000 [IU] | ORAL_CAPSULE | ORAL | 5 refills | Status: DC
  Filled 2022-11-22: qty 4, 28d supply, fill #0
  Filled 2022-12-15: qty 4, 28d supply, fill #1
  Filled 2023-01-15: qty 4, 28d supply, fill #2
  Filled 2023-02-05 – 2023-02-07 (×2): qty 4, 28d supply, fill #3
  Filled 2023-03-09: qty 4, 28d supply, fill #4
  Filled 2023-04-06: qty 4, 28d supply, fill #5

## 2022-11-29 ENCOUNTER — Other Ambulatory Visit (HOSPITAL_COMMUNITY): Payer: Self-pay

## 2022-11-29 DIAGNOSIS — N3944 Nocturnal enuresis: Secondary | ICD-10-CM | POA: Diagnosis not present

## 2022-11-29 MED ORDER — DESMOPRESSIN ACETATE 0.2 MG PO TABS
ORAL_TABLET | ORAL | 11 refills | Status: DC
  Filled 2022-11-29: qty 100, 40d supply, fill #0
  Filled 2023-01-15: qty 100, 33d supply, fill #1

## 2022-12-05 ENCOUNTER — Other Ambulatory Visit (HOSPITAL_COMMUNITY): Payer: Self-pay

## 2022-12-05 DIAGNOSIS — N926 Irregular menstruation, unspecified: Secondary | ICD-10-CM | POA: Diagnosis not present

## 2022-12-05 MED ORDER — APRI 0.15-30 MG-MCG PO TABS
1.0000 | ORAL_TABLET | Freq: Every day | ORAL | 4 refills | Status: AC
  Filled 2022-12-05: qty 84, 84d supply, fill #0
  Filled 2023-06-08: qty 84, 84d supply, fill #1

## 2022-12-06 ENCOUNTER — Other Ambulatory Visit (HOSPITAL_COMMUNITY): Payer: Self-pay

## 2022-12-13 DIAGNOSIS — F331 Major depressive disorder, recurrent, moderate: Secondary | ICD-10-CM | POA: Diagnosis not present

## 2022-12-13 DIAGNOSIS — F900 Attention-deficit hyperactivity disorder, predominantly inattentive type: Secondary | ICD-10-CM | POA: Diagnosis not present

## 2022-12-13 DIAGNOSIS — F411 Generalized anxiety disorder: Secondary | ICD-10-CM | POA: Diagnosis not present

## 2022-12-20 ENCOUNTER — Other Ambulatory Visit (HOSPITAL_COMMUNITY): Payer: Self-pay

## 2022-12-26 ENCOUNTER — Encounter (HOSPITAL_COMMUNITY): Payer: Self-pay | Admitting: Psychiatry

## 2022-12-26 ENCOUNTER — Other Ambulatory Visit (HOSPITAL_COMMUNITY): Payer: Self-pay

## 2022-12-26 ENCOUNTER — Telehealth (INDEPENDENT_AMBULATORY_CARE_PROVIDER_SITE_OTHER): Payer: 59 | Admitting: Psychiatry

## 2022-12-26 DIAGNOSIS — E559 Vitamin D deficiency, unspecified: Secondary | ICD-10-CM | POA: Diagnosis not present

## 2022-12-26 DIAGNOSIS — F331 Major depressive disorder, recurrent, moderate: Secondary | ICD-10-CM | POA: Diagnosis not present

## 2022-12-26 DIAGNOSIS — R5383 Other fatigue: Secondary | ICD-10-CM

## 2022-12-26 DIAGNOSIS — Z6834 Body mass index (BMI) 34.0-34.9, adult: Secondary | ICD-10-CM | POA: Diagnosis not present

## 2022-12-26 DIAGNOSIS — F411 Generalized anxiety disorder: Secondary | ICD-10-CM

## 2022-12-26 DIAGNOSIS — F3130 Bipolar disorder, current episode depressed, mild or moderate severity, unspecified: Secondary | ICD-10-CM

## 2022-12-26 DIAGNOSIS — R7303 Prediabetes: Secondary | ICD-10-CM | POA: Diagnosis not present

## 2022-12-26 MED ORDER — ARIPIPRAZOLE 5 MG PO TABS
5.0000 mg | ORAL_TABLET | Freq: Every day | ORAL | 2 refills | Status: DC
  Filled 2022-12-26 – 2022-12-27 (×2): qty 30, 30d supply, fill #0
  Filled 2023-01-22: qty 30, 30d supply, fill #1
  Filled 2023-03-01: qty 30, 30d supply, fill #2

## 2022-12-26 MED ORDER — ESCITALOPRAM OXALATE 20 MG PO TABS
20.0000 mg | ORAL_TABLET | Freq: Every day | ORAL | 2 refills | Status: DC
  Filled 2022-12-26 – 2022-12-27 (×2): qty 30, 30d supply, fill #0
  Filled 2023-01-22: qty 30, 30d supply, fill #1
  Filled 2023-02-21: qty 30, 30d supply, fill #2

## 2022-12-26 MED ORDER — TRULICITY 1.5 MG/0.5ML ~~LOC~~ SOAJ
1.5000 mg | SUBCUTANEOUS | 1 refills | Status: DC
  Filled 2022-12-26: qty 6, 84d supply, fill #0
  Filled 2022-12-27 – 2023-01-12 (×2): qty 2, 28d supply, fill #0
  Filled 2023-02-04: qty 2, 28d supply, fill #1
  Filled 2023-03-08: qty 2, 28d supply, fill #2
  Filled 2023-04-02: qty 2, 28d supply, fill #3

## 2022-12-26 NOTE — Progress Notes (Signed)
[facility] Follow up visit  Patient Identification: Kellie Kline MRN:  [id number] Date of Evaluation:  12/26/2022 Referral Source: primary care Chief Complaint:   No chief complaint on file.  Follow up depression, anxiety   Visit Diagnosis:    ICD-10-CM   1. Bipolar I disorder, most recent episode depressed (HCC)  F31.30     2. GAD (generalized anxiety disorder)  F41.1     3. Tiredness  R53.83      MDD recurrent, rule out bipolar depressed GAD,  Tiredness Virtual Visit via Video Note  I connected with Kellie Kline on 12/26/22 at  1:00 PM EDT by a video enabled telemedicine application and verified that I am speaking with the correct person using two identifiers.  Location: Patient: home Provider: home office   I discussed the limitations of evaluation and management by telemedicine and the availability of in person appointments. The patient expressed understanding and agreed to proceed.      I discussed the assessment and treatment plan with the patient. The patient was provided an opportunity to ask questions and all were answered. The patient agreed with the plan and demonstrated an understanding of the instructions.   The patient was advised to call back or seek an in-person evaluation if the symptoms worsen or if the condition fails to improve as anticipated.  I provided 20 minutes of non-face-to-face time during this encounter.      History of Present Illness: Patient is a 22 years old currently single African-American female who is living with her adoptive mom, sister  initially by primary care physician to establish care and transfer from [facility].   She has transferred to [facility] clinic [facility] current job is better less stressful more life balance  Depression is balance tolerating meds and no side effects  Some extra spending at times but working in therapy   Does not want to increase abilify, no tremors s  No clear manic history  Aggravating  factors: biological parents interference when young, some job stress Modifying factor; mom, sister  Duration since young age Severity manageable Hospital admission denies Suicide attempt denies Drug use denies   Past Psychiatric History: depression, anxiety  Previous Psychotropic Medications: No   Substance Abuse History in the last 12 months:  No.  Consequences of Substance Abuse: NA  Past Medical History: History reviewed. No pertinent past medical history. History reviewed. No pertinent surgical history.  Family Psychiatric History: biological parents not sure, maybe grand ma had some sickness  Family History: History reviewed. No pertinent family history.  Social History:   Social History   Socioeconomic History   Marital status: Single    Spouse name: Not on file   Number of children: Not on file   Years of education: Not on file   Highest education level: Not on file  Occupational History   Not on file  Tobacco Use   Smoking status: Never   Smokeless tobacco: Never  Vaping Use   Vaping status: Never Used  Substance and Sexual Activity   Alcohol use: No   Drug use: No   Sexual activity: Never  Other Topics Concern   Not on file  Social History Narrative   Not on file   Social Determinants of Health   Financial Resource Strain: Not on file  Food Insecurity: Medium Risk (12/21/2022)   Received from [facility]   Hunger Vital Sign    Worried About Running Out of Food in the Last Year: Sometimes true  Ran Out of Food in the Last Year: Sometimes true  Transportation Needs: No Transportation Needs (12/21/2022)   Received from [facility]    In the past 12 months, has lack of reliable transportation kept you from medical appointments, meetings, work or from getting things needed for daily living? : No  Physical Activity: Not on file  Stress: Not on file  Social Connections: Not on file    Additional Social History: grew up with  adapted parents, would get upset when biological parents would come in her life and control, emotional concerns  Allergies:   Allergies  Allergen Reactions   Lactose Intolerance (Gi)     Metabolic Disorder Labs: No results found for: "HGBA1C", "MPG" No results found for: "PROLACTIN" No results found for: "CHOL", "TRIG", "HDL", "CHOLHDL", "VLDL", "LDLCALC" No results found for: "TSH"  Therapeutic Level Labs: No results found for: "LITHIUM" No results found for: "CBMZ" No results found for: "VALPROATE"  Current Medications: Current Outpatient Medications  Medication Sig Dispense Refill   ARIPiprazole (ABILIFY) 5 MG tablet Take 1 tablet (5 mg total) by mouth daily. 30 tablet 2   augmented betamethasone dipropionate (DIPROLENE-AF) 0.05 % ointment Apply a small amount to skin twice a day 50 g 3   desmopressin (DDAVP) 0.2 MG tablet Take 1 tablet (0.2 mg total) by mouth daily. Increase to 2 tablets by mouth daily after 1 week if no better. Increase to 3 tablets by mouth daily after 2 weeks if no better. 100 tablet 11   desogestrel-ethinyl estradiol (APRI) 0.15-30 MG-MCG tablet Take 1 tablet by mouth daily. 84 tablet 4   dicyclomine (BENTYL) 20 MG tablet Take 1 tablet (20 mg total) by mouth 2 (two) times daily. (Patient not taking: Reported on 01/18/2020) 20 tablet 0   doxycycline (VIBRAMYCIN) 100 MG capsule Take 1 capsule (100 mg total) by mouth 2 (two) times daily with food 20 capsule 0   Dulaglutide (TRULICITY) 0.75 MG/0.5ML SOPN Inject 0.5 mL (0.75 mg total) under the skin every 7 days. 2 mL 1   escitalopram (LEXAPRO) 20 MG tablet Take 1 tablet (20 mg) by mouth at bedtime. 30 tablet 2   loratadine (CLARITIN) 10 MG tablet Take 1 tablet (10 mg total) by mouth daily. (Patient not taking: Reported on 10/12/2020) 30 tablet 0   metFORMIN (GLUCOPHAGE-XR) 500 MG 24 hr tablet Take 1 tablet (500 mg total) by mouth daily after breakfast. 90 tablet 1   ondansetron (ZOFRAN ODT) 4 MG disintegrating  tablet Take 1 tablet (4 mg total) by mouth every 8 (eight) hours as needed for nausea or vomiting. 20 tablet 0   sodium chloride (OCEAN NASAL SPRAY) 0.65 % nasal spray Place 1 spray into the nose as needed for congestion. (Patient not taking: Reported on 10/27/2017) 30 mL 12   tacrolimus (PROTOPIC) 0.1 % ointment Apply a small amount to affected area twice a day 30 g 11   Vitamin D, Ergocalciferol, (DRISDOL) 1.25 MG (50000 UNIT) CAPS capsule Take 50,000 Units by mouth once a week.     Vitamin D, Ergocalciferol, (DRISDOL) 1.25 MG (50000 UNIT) CAPS capsule Take 1 capsule by mouth once a week. 4 capsule 5   No current facility-administered medications for this visit.    Psychiatric Specialty Exam: Review of Systems  Cardiovascular:  Negative for chest pain.  Psychiatric/Behavioral:  Negative for agitation, dysphoric mood and sleep disturbance.     There were no vitals taken for this visit.There is no height or weight on file  to calculate BMI.  General Appearance: Casual  Eye Contact:  Fair  Speech:  Clear and Coherent  Volume:  Normal  Mood:  Euthymic  Affect:  Constricted  Thought Process:  Goal Directed  Orientation:  Full (Time, Place, and Person)  Thought Content:  Logical  Suicidal Thoughts:  No  Homicidal Thoughts:  No  Memory:  Immediate;   Fair  Judgement:  Fair  Insight:  Fair  Psychomotor Activity:  Normal  Concentration:  Concentration: Fair  Recall:  [employer] of Knowledge:Good  Language: Good  Akathisia:  No  Handed:    AIMS (if indicated):  no involuntary movement  Assets:  Desire for Improvement Housing  ADL's:  Intact  Cognition: WNL  Sleep:  Fair   Screenings: PHQ2-9    Flowsheet Row [facility] from 01/18/2020 in [facility] Department at [facility]  PHQ-2 Total Score 1 2  PHQ-9 Total Score -- 6      Flowsheet Row [facility] from 01/18/2020 in [facility] Department at [facility]  C-SSRS RISK CATEGORY No Risk Error: Q2 is Yes, you must answer 3, 4, and 5       Assessment and Plan: as follows  Prior documentation reviewed  MDD recurrent moderate: doing fair continu lexapro And abilify  No clear history of manic episodes, if needed can increase abilify   GAD: manageable continue lexapro  Tiredness: did not voice concern, takes naps at times. Overall not worse Continue sleep hygiene and balance of work life.   Fu 41m.  Collaboration of Care: Primary Care Provider AEB reviewed notes and referral  Patient/Guardian was advised Release of Information must be obtained prior to any record release in order to collaborate their care with an outside provider. Patient/Guardian was advised if they have not already done so to contact the registration department to sign all necessary forms in order for [place] to release information regarding their care.   Consent: Patient/Guardian gives verbal consent for treatment and assignment of benefits for services provided during this visit. Patient/Guardian expressed understanding and agreed to proceed.   Thresa Ross, MD 9/9/20241:18 PM

## 2022-12-27 ENCOUNTER — Other Ambulatory Visit: Payer: Self-pay

## 2022-12-27 ENCOUNTER — Encounter (HOSPITAL_COMMUNITY): Payer: Self-pay | Admitting: Pharmacist

## 2022-12-27 ENCOUNTER — Other Ambulatory Visit (HOSPITAL_COMMUNITY): Payer: Self-pay

## 2022-12-29 ENCOUNTER — Encounter: Payer: Self-pay | Admitting: Skilled Nursing Facility1

## 2022-12-29 ENCOUNTER — Encounter: Payer: 59 | Attending: Family Medicine | Admitting: Skilled Nursing Facility1

## 2022-12-29 VITALS — Ht 67.0 in | Wt 220.0 lb

## 2022-12-29 DIAGNOSIS — E631 Imbalance of constituents of food intake: Secondary | ICD-10-CM | POA: Insufficient documentation

## 2022-12-29 NOTE — Progress Notes (Signed)
Medical Nutrition Therapy  Appointment Start time:  4:42  Appointment End time:  5:18  Primary concerns today: prediabetes  Referral diagnosis: [employer] employee    NUTRITION ASSESSMENT    Clinical Medical Hx: prediabetes Medications: metformin Trulicity  Labs: A1C 6.4 Notable Signs/Symptoms: sleepy  Lifestyle & Dietary Hx  Pt states she has been taking Trulicity for weight loss. Pt states she has a strong family hx for DM. Pt states she feels she is sleeping too much due to depression.  Pt states she lives with her mom ands sister stating they are all doing a no flour no sugar plan.   Estimated daily fluid intake: 50+ oz Supplements: iron  Sleep: in bed around 9-10 pm and up by 6-6:30 am Stress / self-care:  Current average weekly physical activity: ADL's  24-Hr Dietary Recall First Meal: chicken biscuit and fries Snack: banana Second Meal: panera  Snack: orange  Third Meal: chipotle  Snack:  Beverages: sweet tea, lemonade, apple juice, ornage juice   NUTRITION INTERVENTION  Nutrition education (E-1) on the following topics:  Creation of balanced and diverse meals to increase the intake of nutrient-rich foods that provide essential vitamins, minerals, fiber, and phytonutrients  Variety of Fruits and Vegetables:  Aim for a colorful array of fruits and vegetables to ensure a wide range of nutrients. Include a mix of leafy greens, berries, citrus fruits, cruciferous vegetables, and more. Whole Grains: Choose whole grains over refined grains. Examples include brown rice, quinoa, oats, whole wheat, and barley. Lean Proteins: Include lean sources of protein, such as poultry, fish, tofu, legumes, beans, lentils, and low-fat dairy products. Limit red and processed meats. Healthy Fats: Incorporate sources of healthy fats, including avocados, nuts, seeds, and olive oil. Limit saturated and trans fats found in fried and processed foods. Dairy or Dairy  Alternatives: Choose low-fat or fat-free dairy products, or plant-based alternatives like almond or soy milk. Portion Control: Be mindful of portion sizes to avoid overeating. Pay attention to hunger and satisfaction cues. Limit Added Sugars: Minimize the consumption of sugary beverages, snacks, and desserts. Check food labels for added sugars and opt for natural sources of sweetness such as whole fruits. Hydration: Drink plenty of water throughout the day. Limit sugary drinks and excessive caffeine intake. Moderate Sodium Intake: Reduce the consumption of high-sodium foods. Use herbs and spices for flavor instead of excessive salt. Meal Planning and Preparation: Plan and prepare meals ahead of time to make healthier choices more convenient. Include a mix of food groups in each meal. Limit Processed Foods: Minimize the intake of highly processed and packaged foods that are often high in added sugars, salt, and unhealthy fats. Regular Physical Activity: Combine a healthy diet with regular physical activity for overall well-being. Aim for at least 150 minutes of moderate-intensity aerobic exercise per week, along with strength training. Moderation and Balance: Enjoy treats and indulgent foods in moderation, emphasizing balance rather than strict restriction.  Handouts Provided Include  Detailed MyPlate  Learning Style & Readiness for Change Teaching method utilized: Visual & Auditory  Demonstrated degree of understanding via: Teach Back  Barriers to learning/adherence to lifestyle change: unidentified   Goals Established by Pt Try taking a walk at work when your tired instead of drinking a caloric beverage or try a low calorie drink Try a snack in between lunch and dinner to control dinner portions Before making your plate take a deep breath and allow yourself the option of seconds if you are hungry thirty minutes after  you have eaten   MONITORING & EVALUATION Dietary intake,  weekly physical activity  Next Steps  Patient is to follow up for second employee visit.

## 2023-01-03 ENCOUNTER — Ambulatory Visit: Payer: Self-pay | Admitting: Skilled Nursing Facility1

## 2023-01-03 DIAGNOSIS — F331 Major depressive disorder, recurrent, moderate: Secondary | ICD-10-CM | POA: Diagnosis not present

## 2023-01-03 DIAGNOSIS — F900 Attention-deficit hyperactivity disorder, predominantly inattentive type: Secondary | ICD-10-CM | POA: Diagnosis not present

## 2023-01-03 DIAGNOSIS — F411 Generalized anxiety disorder: Secondary | ICD-10-CM | POA: Diagnosis not present

## 2023-01-09 ENCOUNTER — Ambulatory Visit: Payer: 59 | Admitting: Dietician

## 2023-01-12 ENCOUNTER — Other Ambulatory Visit (HOSPITAL_COMMUNITY): Payer: Self-pay

## 2023-01-12 ENCOUNTER — Other Ambulatory Visit: Payer: Self-pay

## 2023-01-16 ENCOUNTER — Other Ambulatory Visit: Payer: Self-pay

## 2023-01-17 DIAGNOSIS — N3944 Nocturnal enuresis: Secondary | ICD-10-CM | POA: Diagnosis not present

## 2023-01-22 ENCOUNTER — Other Ambulatory Visit (HOSPITAL_COMMUNITY): Payer: Self-pay

## 2023-01-24 ENCOUNTER — Other Ambulatory Visit (HOSPITAL_COMMUNITY): Payer: Self-pay

## 2023-01-24 DIAGNOSIS — N3944 Nocturnal enuresis: Secondary | ICD-10-CM | POA: Diagnosis not present

## 2023-01-25 ENCOUNTER — Other Ambulatory Visit (HOSPITAL_COMMUNITY): Payer: Self-pay

## 2023-01-26 DIAGNOSIS — F411 Generalized anxiety disorder: Secondary | ICD-10-CM | POA: Diagnosis not present

## 2023-01-26 DIAGNOSIS — F331 Major depressive disorder, recurrent, moderate: Secondary | ICD-10-CM | POA: Diagnosis not present

## 2023-01-26 DIAGNOSIS — F900 Attention-deficit hyperactivity disorder, predominantly inattentive type: Secondary | ICD-10-CM | POA: Diagnosis not present

## 2023-01-27 ENCOUNTER — Other Ambulatory Visit (HOSPITAL_COMMUNITY): Payer: Self-pay

## 2023-01-27 MED ORDER — DESMOPRESSIN ACETATE 0.2 MG PO TABS
ORAL_TABLET | ORAL | 3 refills | Status: DC
  Filled 2023-01-27: qty 180, 60d supply, fill #0

## 2023-01-30 ENCOUNTER — Other Ambulatory Visit (HOSPITAL_COMMUNITY): Payer: Self-pay

## 2023-01-31 ENCOUNTER — Other Ambulatory Visit (HOSPITAL_COMMUNITY): Payer: Self-pay

## 2023-02-01 ENCOUNTER — Other Ambulatory Visit (HOSPITAL_COMMUNITY): Payer: Self-pay

## 2023-02-02 ENCOUNTER — Other Ambulatory Visit (HOSPITAL_COMMUNITY): Payer: Self-pay

## 2023-02-02 MED ORDER — DESMOPRESSIN ACETATE 0.2 MG PO TABS
ORAL_TABLET | ORAL | 3 refills | Status: DC
  Filled 2023-02-02: qty 180, 63d supply, fill #0
  Filled 2023-02-11: qty 180, 60d supply, fill #0
  Filled 2023-04-14: qty 180, 60d supply, fill #1
  Filled 2023-09-17: qty 180, 60d supply, fill #2
  Filled 2023-12-26: qty 180, 60d supply, fill #3

## 2023-02-03 ENCOUNTER — Other Ambulatory Visit: Payer: Self-pay

## 2023-02-03 ENCOUNTER — Other Ambulatory Visit (HOSPITAL_COMMUNITY): Payer: Self-pay

## 2023-02-04 ENCOUNTER — Other Ambulatory Visit (HOSPITAL_COMMUNITY): Payer: Self-pay

## 2023-02-05 ENCOUNTER — Other Ambulatory Visit (HOSPITAL_COMMUNITY): Payer: Self-pay

## 2023-02-06 ENCOUNTER — Other Ambulatory Visit (HOSPITAL_COMMUNITY): Payer: 59

## 2023-02-06 ENCOUNTER — Other Ambulatory Visit (HOSPITAL_COMMUNITY): Payer: Self-pay

## 2023-02-08 DIAGNOSIS — F900 Attention-deficit hyperactivity disorder, predominantly inattentive type: Secondary | ICD-10-CM | POA: Diagnosis not present

## 2023-02-08 DIAGNOSIS — F411 Generalized anxiety disorder: Secondary | ICD-10-CM | POA: Diagnosis not present

## 2023-02-08 DIAGNOSIS — F331 Major depressive disorder, recurrent, moderate: Secondary | ICD-10-CM | POA: Diagnosis not present

## 2023-02-11 ENCOUNTER — Other Ambulatory Visit (HOSPITAL_COMMUNITY): Payer: Self-pay

## 2023-02-21 ENCOUNTER — Encounter (INDEPENDENT_AMBULATORY_CARE_PROVIDER_SITE_OTHER): Payer: Self-pay

## 2023-02-21 ENCOUNTER — Other Ambulatory Visit (HOSPITAL_COMMUNITY): Payer: Self-pay

## 2023-02-21 ENCOUNTER — Other Ambulatory Visit: Payer: Self-pay

## 2023-02-27 ENCOUNTER — Encounter (HOSPITAL_COMMUNITY): Payer: Self-pay

## 2023-02-27 ENCOUNTER — Other Ambulatory Visit (HOSPITAL_COMMUNITY): Payer: 59

## 2023-02-27 ENCOUNTER — Other Ambulatory Visit (HOSPITAL_COMMUNITY): Payer: Self-pay

## 2023-02-27 DIAGNOSIS — R7303 Prediabetes: Secondary | ICD-10-CM | POA: Diagnosis not present

## 2023-02-27 DIAGNOSIS — Z6834 Body mass index (BMI) 34.0-34.9, adult: Secondary | ICD-10-CM | POA: Diagnosis not present

## 2023-02-27 DIAGNOSIS — R110 Nausea: Secondary | ICD-10-CM | POA: Diagnosis not present

## 2023-02-27 DIAGNOSIS — F319 Bipolar disorder, unspecified: Secondary | ICD-10-CM | POA: Diagnosis not present

## 2023-02-27 MED ORDER — ONDANSETRON HCL 4 MG PO TABS
4.0000 mg | ORAL_TABLET | Freq: Two times a day (BID) | ORAL | 0 refills | Status: DC
  Filled 2023-02-27: qty 30, 15d supply, fill #0

## 2023-02-27 MED ORDER — METFORMIN HCL ER 500 MG PO TB24
500.0000 mg | ORAL_TABLET | Freq: Every day | ORAL | 1 refills | Status: DC
  Filled 2023-02-27: qty 90, 90d supply, fill #0

## 2023-02-28 DIAGNOSIS — F411 Generalized anxiety disorder: Secondary | ICD-10-CM | POA: Diagnosis not present

## 2023-02-28 DIAGNOSIS — F900 Attention-deficit hyperactivity disorder, predominantly inattentive type: Secondary | ICD-10-CM | POA: Diagnosis not present

## 2023-02-28 DIAGNOSIS — F331 Major depressive disorder, recurrent, moderate: Secondary | ICD-10-CM | POA: Diagnosis not present

## 2023-03-01 ENCOUNTER — Other Ambulatory Visit: Payer: Self-pay

## 2023-03-08 ENCOUNTER — Other Ambulatory Visit (HOSPITAL_COMMUNITY): Payer: 59

## 2023-03-09 ENCOUNTER — Other Ambulatory Visit: Payer: Self-pay

## 2023-03-09 ENCOUNTER — Other Ambulatory Visit (HOSPITAL_COMMUNITY): Payer: Self-pay

## 2023-03-27 ENCOUNTER — Encounter (HOSPITAL_COMMUNITY): Payer: Self-pay | Admitting: Psychiatry

## 2023-03-27 ENCOUNTER — Telehealth (HOSPITAL_COMMUNITY): Payer: 59 | Admitting: Psychiatry

## 2023-03-27 ENCOUNTER — Other Ambulatory Visit (HOSPITAL_COMMUNITY): Payer: Self-pay

## 2023-03-27 ENCOUNTER — Other Ambulatory Visit: Payer: Self-pay

## 2023-03-27 DIAGNOSIS — F3130 Bipolar disorder, current episode depressed, mild or moderate severity, unspecified: Secondary | ICD-10-CM

## 2023-03-27 DIAGNOSIS — F331 Major depressive disorder, recurrent, moderate: Secondary | ICD-10-CM

## 2023-03-27 DIAGNOSIS — F411 Generalized anxiety disorder: Secondary | ICD-10-CM

## 2023-03-27 MED ORDER — ARIPIPRAZOLE 5 MG PO TABS
5.0000 mg | ORAL_TABLET | Freq: Every day | ORAL | 2 refills | Status: DC
  Filled 2023-03-27: qty 30, 30d supply, fill #0
  Filled 2023-04-25: qty 30, 30d supply, fill #1
  Filled 2023-05-30: qty 30, 30d supply, fill #2

## 2023-03-27 MED ORDER — ESCITALOPRAM OXALATE 20 MG PO TABS
20.0000 mg | ORAL_TABLET | Freq: Every day | ORAL | 2 refills | Status: DC
  Filled 2023-03-27: qty 30, 30d supply, fill #0
  Filled 2023-04-25: qty 30, 30d supply, fill #1
  Filled 2023-05-30: qty 30, 30d supply, fill #2

## 2023-03-27 NOTE — Progress Notes (Signed)
[facility] Follow up visit  Patient Identification: Kellie Kline MRN:  [id number] Date of Evaluation:  03/27/2023 Referral Source: primary care Chief Complaint:   No chief complaint on file.  Follow up depression, anxiety   Visit Diagnosis:    ICD-10-CM   1. Bipolar I disorder, most recent episode depressed (HCC)  F31.30     2. GAD (generalized anxiety disorder)  F41.1     3. MDD (major depressive disorder), recurrent episode, moderate (HCC)  F33.1      MDD recurrent, rule out bipolar depressed GAD,  Tiredness  Virtual Visit via Video Note  I connected with Kellie Kline on 03/27/23 at  2:30 PM EST by a video enabled telemedicine application and verified that I am speaking with the correct person using two identifiers.  Location: Patient: home Provider: home office   I discussed the limitations of evaluation and management by telemedicine and the availability of in person appointments. The patient expressed understanding and agreed to proceed.       I discussed the assessment and treatment plan with the patient. The patient was provided an opportunity to ask questions and all were answered. The patient agreed with the plan and demonstrated an understanding of the instructions.   The patient was advised to call back or seek an in-person evaluation if the symptoms worsen or if the condition fails to improve as anticipated.  I provided 18 minutes of non-face-to-face time during this encounter.      History of Present Illness: Patient is a 22 years old currently single African-American female who is living with her adoptive mom, sister  initially by primary care physician to establish care and transfer from [facility].   She has transferred to [facility] with [facility] , likes her job  Doing fair in regard to depression and anxiety  Sleep and tiredness improved as well  No tremors or side effects    Some extra spending at times but working in therapy   Does not  want to increase abilify, no tremors   Aggravating factors: biological parents interference when young, some job stress Modifying factor; mom, sister  Duration since young age Severity manageable, improved Hospital admission denies Suicide attempt denies Drug use denies   Past Psychiatric History: depression, anxiety  Previous Psychotropic Medications: No   Substance Abuse History in the last 12 months:  No.  Consequences of Substance Abuse: NA  Past Medical History: History reviewed. No pertinent past medical history. History reviewed. No pertinent surgical history.  Family Psychiatric History: biological parents not sure, maybe grand ma had some sickness  Family History: History reviewed. No pertinent family history.  Social History:   Social History   Socioeconomic History   Marital status: Single    Spouse name: Not on file   Number of children: Not on file   Years of education: Not on file   Highest education level: Not on file  Occupational History   Not on file  Tobacco Use   Smoking status: Never   Smokeless tobacco: Never  Vaping Use   Vaping status: Never Used  Substance and Sexual Activity   Alcohol use: No   Drug use: No   Sexual activity: Never  Other Topics Concern   Not on file  Social History Narrative   Not on file   Social Determinants of Health   Financial Resource Strain: Not on file  Food Insecurity: Low Risk  (12/26/2022)   Received from [facility]   Hunger Vital Sign  Worried About Programme researcher, broadcasting/film/video in the Last Year: Never true    Ran Out of Food in the Last Year: Never true  Recent Concern: Food Insecurity - Medium Risk (12/21/2022)   Received from [facility]   Hunger Vital Sign    Worried About Running Out of Food in the Last Year: Sometimes true    Ran Out of Food in the Last Year: Sometimes true  Transportation Needs: No Transportation Needs (12/26/2022)   Received from [facility]    In the past 12  months, has lack of reliable transportation kept you from medical appointments, meetings, work or from getting things needed for daily living? : No  Physical Activity: Not on file  Stress: Not on file  Social Connections: Not on file    Additional Social History: grew up with adapted parents, would get upset when biological parents would come in her life and control, emotional concerns  Allergies:   Allergies  Allergen Reactions   Lactose Intolerance (Gi)     Metabolic Disorder Labs: No results found for: "HGBA1C", "MPG" No results found for: "PROLACTIN" No results found for: "CHOL", "TRIG", "HDL", "CHOLHDL", "VLDL", "LDLCALC" No results found for: "TSH"  Therapeutic Level Labs: No results found for: "LITHIUM" No results found for: "CBMZ" No results found for: "VALPROATE"  Current Medications: Current Outpatient Medications  Medication Sig Dispense Refill   ARIPiprazole (ABILIFY) 5 MG tablet Take 1 tablet (5 mg total) by mouth daily. 30 tablet 2   augmented betamethasone dipropionate (DIPROLENE-AF) 0.05 % ointment Apply a small amount to skin twice a day 50 g 3   desmopressin (DDAVP) 0.2 MG tablet Take 2 tablets by mouth daily for 1 week.  After 2 weeks if no improvement, increase to 3 tablets daily as directed. 180 tablet 3   desogestrel-ethinyl estradiol (APRI) 0.15-30 MG-MCG tablet Take 1 tablet by mouth daily. 84 tablet 4   dicyclomine (BENTYL) 20 MG tablet Take 1 tablet (20 mg total) by mouth 2 (two) times daily. (Patient not taking: Reported on 01/18/2020) 20 tablet 0   doxycycline (VIBRAMYCIN) 100 MG capsule Take 1 capsule (100 mg total) by mouth 2 (two) times daily with food 20 capsule 0   Dulaglutide (TRULICITY) 1.5 MG/0.5ML SOAJ Inject 1.5 mg into the skin every 7 (seven) days. 6 mL 1   escitalopram (LEXAPRO) 20 MG tablet Take 1 tablet (20 mg) by mouth at bedtime. 30 tablet 2   loratadine (CLARITIN) 10 MG tablet Take 1 tablet (10 mg total) by mouth daily. (Patient not  taking: Reported on 10/12/2020) 30 tablet 0   metFORMIN (GLUCOPHAGE-XR) 500 MG 24 hr tablet Take 1 tablet (500 mg total) by mouth daily after breakfast. 90 tablet 1   metFORMIN (GLUCOPHAGE-XR) 500 MG 24 hr tablet Take 1 tablet (500 mg total) by mouth daily with breakfast. 90 tablet 1   ondansetron (ZOFRAN ODT) 4 MG disintegrating tablet Take 1 tablet (4 mg total) by mouth every 8 (eight) hours as needed for nausea or vomiting. 20 tablet 0   ondansetron (ZOFRAN) 4 MG tablet Take 1 tablet (4 mg total) by mouth every 12 (twelve) hours as needed for nausea or vomiting 30 tablet 0   sodium chloride (OCEAN NASAL SPRAY) 0.65 % nasal spray Place 1 spray into the nose as needed for congestion. (Patient not taking: Reported on 10/27/2017) 30 mL 12   tacrolimus (PROTOPIC) 0.1 % ointment Apply a small amount to affected area twice a day 30 g 11  Vitamin D, Ergocalciferol, (DRISDOL) 1.25 MG (50000 UNIT) CAPS capsule Take 50,000 Units by mouth once a week.     Vitamin D, Ergocalciferol, (DRISDOL) 1.25 MG (50000 UNIT) CAPS capsule Take 1 capsule (50,000 Units total) by mouth once a week. 4 capsule 5   No current facility-administered medications for this visit.    Psychiatric Specialty Exam: Review of Systems  Cardiovascular:  Negative for chest pain.  Psychiatric/Behavioral:  Negative for agitation, dysphoric mood and sleep disturbance.     There were no vitals taken for this visit.There is no height or weight on file to calculate BMI.  General Appearance: Casual  Eye Contact:  Fair  Speech:  Clear and Coherent  Volume:  Normal  Mood:  Euthymic  Affect:  Constricted  Thought Process:  Goal Directed  Orientation:  Full (Time, Place, and Person)  Thought Content:  Logical  Suicidal Thoughts:  No  Homicidal Thoughts:  No  Memory:  Immediate;   Fair  Judgement:  Fair  Insight:  Fair  Psychomotor Activity:  Normal  Concentration:  Concentration: Fair  Recall:  [employer] of Knowledge:Good  Language:  Good  Akathisia:  No  Handed:    AIMS (if indicated):  no involuntary movement  Assets:  Desire for Improvement Housing  ADL's:  Intact  Cognition: WNL  Sleep:  Fair   Screenings: PHQ2-9    Flowsheet Row Nutrition from 12/29/2022 in [facility]  - [facility] Office Visit from [facility]/14/2022 in [facility] from 01/18/2020 in [facility] Department at [facility]  PHQ-2 Total Score 0 1 2  PHQ-9 Total Score -- -- 6      Flowsheet Row Office Visit from [facility]14[facility]20[facility] from 01/18/2020 in [facility] Emergency Department at [facility]  C-SSRS RISK CATEGORY No Risk Error: Q2 is Yes, you must answer 3, 4, and 5       Assessment and Plan: as follows  Prior documentation reviewed  MDD recurrent moderate:  doing fair continue  lexapro and abilify  No clear history of manic episodes, if needed can increase abilify   GAD: manageable continue lexapro  Tiredness: improved continue sleep hygiene and activities during the day Continue sleep hygiene and balance of work life.   Fu 26m.  Collaboration of Care: Primary Care Provider AEB reviewed notes and referral  Patient/Guardian was advised Release of Information must be obtained prior to any record release in order to collaborate their care with an outside provider. Patient/Guardian was advised if they have not already done so to contact the registration department to sign all necessary forms in order for [place] to release information regarding their care.   Consent: Patient/Guardian gives verbal consent for treatment and assignment of benefits for services provided during this visit. Patient/Guardian expressed understanding and agreed to proceed.   Thresa Ross, MD 12/9/20242:34 PM

## 2023-03-29 DIAGNOSIS — F900 Attention-deficit hyperactivity disorder, predominantly inattentive type: Secondary | ICD-10-CM | POA: Diagnosis not present

## 2023-03-29 DIAGNOSIS — F411 Generalized anxiety disorder: Secondary | ICD-10-CM | POA: Diagnosis not present

## 2023-03-29 DIAGNOSIS — F331 Major depressive disorder, recurrent, moderate: Secondary | ICD-10-CM | POA: Diagnosis not present

## 2023-04-03 ENCOUNTER — Other Ambulatory Visit: Payer: Self-pay

## 2023-04-06 ENCOUNTER — Other Ambulatory Visit: Payer: Self-pay

## 2023-04-14 ENCOUNTER — Other Ambulatory Visit: Payer: Self-pay

## 2023-04-25 ENCOUNTER — Other Ambulatory Visit: Payer: Self-pay

## 2023-04-26 ENCOUNTER — Other Ambulatory Visit (HOSPITAL_COMMUNITY): Payer: Self-pay

## 2023-04-26 ENCOUNTER — Other Ambulatory Visit: Payer: Self-pay

## 2023-04-26 DIAGNOSIS — F331 Major depressive disorder, recurrent, moderate: Secondary | ICD-10-CM | POA: Diagnosis not present

## 2023-04-26 DIAGNOSIS — F900 Attention-deficit hyperactivity disorder, predominantly inattentive type: Secondary | ICD-10-CM | POA: Diagnosis not present

## 2023-04-26 DIAGNOSIS — F411 Generalized anxiety disorder: Secondary | ICD-10-CM | POA: Diagnosis not present

## 2023-04-26 MED ORDER — TRULICITY 3 MG/0.5ML ~~LOC~~ SOAJ
3.0000 mg | SUBCUTANEOUS | 3 refills | Status: DC
  Filled 2023-04-26: qty 2, 28d supply, fill #0
  Filled 2023-05-30: qty 2, 28d supply, fill #1

## 2023-04-26 MED ORDER — METFORMIN HCL ER 500 MG PO TB24
500.0000 mg | ORAL_TABLET | Freq: Every day | ORAL | 1 refills | Status: DC
  Filled 2023-05-30: qty 90, 90d supply, fill #0
  Filled 2023-08-21: qty 90, 90d supply, fill #1

## 2023-04-26 MED ORDER — ONDANSETRON HCL 4 MG PO TABS
4.0000 mg | ORAL_TABLET | Freq: Two times a day (BID) | ORAL | 0 refills | Status: AC | PRN
  Filled 2023-04-26: qty 30, 15d supply, fill #0

## 2023-04-27 ENCOUNTER — Other Ambulatory Visit (HOSPITAL_COMMUNITY): Payer: Self-pay

## 2023-05-02 ENCOUNTER — Other Ambulatory Visit (HOSPITAL_COMMUNITY): Payer: Self-pay

## 2023-05-12 ENCOUNTER — Other Ambulatory Visit (HOSPITAL_COMMUNITY): Payer: Self-pay

## 2023-05-14 ENCOUNTER — Other Ambulatory Visit (HOSPITAL_COMMUNITY): Payer: Self-pay

## 2023-05-15 ENCOUNTER — Other Ambulatory Visit (HOSPITAL_COMMUNITY): Payer: Self-pay

## 2023-05-15 MED ORDER — VITAMIN D (ERGOCALCIFEROL) 1.25 MG (50000 UNIT) PO CAPS
50000.0000 [IU] | ORAL_CAPSULE | ORAL | 5 refills | Status: DC
  Filled 2023-05-15: qty 4, 28d supply, fill #0
  Filled 2023-06-08: qty 4, 28d supply, fill #1

## 2023-05-24 ENCOUNTER — Ambulatory Visit: Payer: 59 | Admitting: Family Medicine

## 2023-05-24 ENCOUNTER — Encounter: Payer: Self-pay | Admitting: Family Medicine

## 2023-05-24 VITALS — BP 128/80 | HR 90 | Temp 98.2°F | Ht 65.0 in | Wt 220.6 lb

## 2023-05-24 DIAGNOSIS — R7303 Prediabetes: Secondary | ICD-10-CM | POA: Diagnosis not present

## 2023-05-24 DIAGNOSIS — Z7689 Persons encountering health services in other specified circumstances: Secondary | ICD-10-CM

## 2023-05-24 DIAGNOSIS — R110 Nausea: Secondary | ICD-10-CM | POA: Diagnosis not present

## 2023-05-24 NOTE — Progress Notes (Signed)
 New Patient Office Visit  Subjective   Patient ID: Kellie Kline, female    DOB: October 02, 2000  Age: 23 y.o. MRN: [id number]  CC:  Chief Complaint  Patient presents with   Establish Care    HPI [name] presents to establish care with new provider.   Patients previous primary care provider was [facility] [place] by Dr. Elsie Favorite. Last visit was 04/26/2023. Reason for transferring due to location and time of appointments.   Specialist: Psychiatry: [facility] Dr. Jackey Flight  GYN: [facility] with Mliss Harrison, [place] Urology: [facility] [place]-Dr. Evalene Nam  Dermatology: [facility] (As needed)   Prediabetes: Patient is taking Metformin  500mg  daily and Trulicity  3mg  weekly injection. When she last seen her previous PCP on 04/26/2023, dose was increased. Denies any concerns with medication. Last A1c was 6.4 on 08/2022.   Nausea: Patient is taking Zofran  4mg  tablet every 12 hours as needed for nausea and vomiting.   All other medications are being managed by specialist.   Outpatient Encounter Medications as of 05/24/2023  Medication Sig   augmented betamethasone  dipropionate (DIPROLENE -AF) 0.05 % ointment Apply a small amount to skin twice a day   desmopressin  (DDAVP ) 0.2 MG tablet Take 2 tablets by mouth daily for 1 week.  After 2 weeks if no improvement, increase to 3 tablets daily as directed.   desogestrel -ethinyl estradiol  (APRI ) 0.15-30 MG-MCG tablet Take 1 tablet by mouth daily.   Dulaglutide  (TRULICITY ) 3 MG/0.5ML SOAJ Inject 3 mg into the skin every 7 (seven) days.   escitalopram  (LEXAPRO ) 20 MG tablet Take 1 tablet (20 mg) by mouth at bedtime.   ferrous sulfate 325 (65 FE) MG tablet Take 325 mg by mouth daily with breakfast.   metFORMIN  (GLUCOPHAGE -XR) 500 MG 24 hr tablet Take 1 tablet (500 mg total) by mouth daily with breakfast.    ondansetron  (ZOFRAN ) 4 MG tablet Take 1 tablet (4 mg total) by mouth every 12 (twelve) hours as needed for nausea or vomiting.   tacrolimus  (PROTOPIC ) 0.1 % ointment Apply a small amount to affected area twice a day   Vitamin D , Ergocalciferol , (DRISDOL ) 1.25 MG (50000 UNIT) CAPS capsule Take 1 capsule (50,000 Units total) by mouth once a week.   [DISCONTINUED] doxycycline  (VIBRAMYCIN ) 100 MG capsule Take 1 capsule (100 mg total) by mouth 2 (two) times daily with food   [DISCONTINUED] metFORMIN  (GLUCOPHAGE -XR) 500 MG 24 hr tablet Take 1 tablet (500 mg total) by mouth daily with breakfast.   [DISCONTINUED] ondansetron  (ZOFRAN  ODT) 4 MG disintegrating tablet Take 1 tablet (4 mg total) by mouth every 8 (eight) hours as needed for nausea or vomiting.   [DISCONTINUED] ondansetron  (ZOFRAN ) 4 MG tablet Take 1 tablet (4 mg total) by mouth every 12 (twelve) hours as needed for nausea or vomiting   ARIPiprazole  (ABILIFY ) 5 MG tablet Take 1 tablet (5 mg total) by mouth daily.   [DISCONTINUED] dicyclomine  (BENTYL ) 20 MG tablet Take 1 tablet (20 mg total) by mouth 2 (two) times daily. (Patient not taking: Reported on 01/18/2020)   [DISCONTINUED] loratadine  (CLARITIN ) 10 MG tablet Take 1 tablet (10 mg total) by mouth daily. (Patient not taking: Reported on 10/12/2020)   [DISCONTINUED] metFORMIN  (GLUCOPHAGE -XR) 500 MG 24 hr tablet Take 1 tablet (500 mg total) by mouth daily after breakfast.   [DISCONTINUED] phentermine  (ADIPEX-P ) 37.5 MG tablet Take 0.5 tablets (18.75 mg total) by mouth daily.   [  DISCONTINUED] sodium chloride (OCEAN NASAL SPRAY) 0.65 % nasal spray Place 1 spray into the nose as needed for congestion. (Patient not taking: Reported on 10/27/2017)   [DISCONTINUED] Vitamin D , Ergocalciferol , (DRISDOL ) 1.25 MG (50000 UNIT) CAPS capsule Take 50,000 Units by mouth once a week.   No facility-administered encounter medications on file as of 05/24/2023.    Past Medical History:  Diagnosis Date   Allergy     Anemia    Anxiety    Depression    Diabetes mellitus without complication (HCC)    Heart murmur    PCOS (polycystic ovarian syndrome)    Vitamin D  deficiency     Past Surgical History:  Procedure Laterality Date   TONSILLECTOMY     TYMPANOSTOMY TUBE PLACEMENT      Family History  Problem Relation Age of Onset   ADD / ADHD Mother    Anxiety disorder Mother    Depression Mother    Diabetes Mother    Obesity Mother     Social History   Socioeconomic History   Marital status: Single    Spouse name: Not on file   Number of children: 0   Years of education: Not on file   Highest education level: Some college, no degree  Occupational History   Not on file  Tobacco Use   Smoking status: Never   Smokeless tobacco: Never   Tobacco comments:    Never smoked before in my life  Vaping Use   Vaping status: Never Used  Substance and Sexual Activity   Alcohol use: Never   Drug use: Never   Sexual activity: Never  Other Topics Concern   Not on file  Social History Narrative   Not on file   Social Drivers of Health   Financial Resource Strain: Low Risk  (05/23/2023)   Overall Financial Resource Strain (CARDIA)    Difficulty of Paying Living Expenses: Not very hard  Food Insecurity: Food Insecurity Present (05/23/2023)   Hunger Vital Sign    Worried About Running Out of Food in the Last Year: Sometimes true    Ran Out of Food in the Last Year: Sometimes true  Transportation Needs: No Transportation Needs (05/23/2023)   PRAPARE - Administrator, Civil Service (Medical): No    Lack of Transportation (Non-Medical): No  Physical Activity: Inactive (05/24/2023)   Exercise Vital Sign    Days of Exercise per Week: 0 days    Minutes of Exercise per Session: 0 min  Stress: Stress Concern Present (05/23/2023)   [employer] of Occupational Health - Occupational Stress Questionnaire    Feeling of Stress : To some extent  Social Connections: Moderately Integrated  (05/23/2023)   Social Connection and Isolation Panel [NHANES]    Frequency of Communication with Friends and Family: More than three times a week    Frequency of Social Gatherings with Friends and Family: Once a week    Attends Religious Services: More than 4 times per year    Active Member of [employer] or Organizations: Yes    Attends Banker Meetings: More than 4 times per year    Marital Status: Never married  Intimate Partner Violence: Not At Risk (05/24/2023)   Humiliation, Afraid, Rape, and Kick questionnaire    Fear of Current or Ex-Partner: No    Emotionally Abused: No    Physically Abused: No    Sexually Abused: No    ROS See HPI above    Objective  BP 128/80 (  BP Location: Left Arm, Patient Position: Sitting, Cuff Size: Normal)   Pulse 90   Temp 98.2 F (36.8 C) (Oral)   Ht 5' 5 (1.651 m)   Wt 220 lb 9.6 oz (100.1 kg)   LMP 04/25/2023   SpO2 97%   BMI 36.71 kg/m   Physical Exam Vitals reviewed.  Constitutional:      General: She is not in acute distress.    Appearance: Normal appearance. She is obese. She is not ill-appearing, toxic-appearing or diaphoretic.  HENT:     Head: Normocephalic and atraumatic.  Eyes:     General:        Right eye: No discharge.        Left eye: No discharge.     Conjunctiva/sclera: Conjunctivae normal.  Cardiovascular:     Rate and Rhythm: Normal rate and regular rhythm.     Heart sounds: Normal heart sounds. No murmur heard.    No friction rub. No gallop.  Pulmonary:     Effort: Pulmonary effort is normal. No respiratory distress.     Breath sounds: Normal breath sounds.  Musculoskeletal:        General: Normal range of motion.  Skin:    General: Skin is warm and dry.  Neurological:     General: No focal deficit present.     Mental Status: She is alert and oriented to person, place, and time. Mental status is at baseline.  Psychiatric:        Mood and Affect: Mood normal.        Behavior: Behavior normal.         Thought Content: Thought content normal.        Judgment: Judgment normal.      Assessment & Plan:  Prediabetes Assessment & Plan: Stable. Continue with Metformin  and Trulicity  weekly injections. Will draw an A1 at her physical exam in 1 month. Denies needing refills.    Nausea Assessment & Plan: Stable. Continue taking Zofran . Denies needing refills on medication.    Encounter to establish care   1.Review health maintenance: -Cervical Cancer Screening: None, but has a GYN -Covid vaccine: Card provided -DTap: Unknown; Health at Work  -Hep C and HIV C: Declines   Return in about 1 month (around 06/21/2023) for physical.   Sativa Gelles, NP

## 2023-05-24 NOTE — Assessment & Plan Note (Signed)
 Stable. Continue with Metformin  and Trulicity  weekly injections. Will draw an A1 at her physical exam in 1 month. Denies needing refills.

## 2023-05-24 NOTE — Assessment & Plan Note (Signed)
 Stable. Continue taking Zofran . Denies needing refills on medication.

## 2023-05-24 NOTE — Patient Instructions (Signed)
-  It was a pleasure to meet you and look forward to taking care of you. -Continue current prescribed medications.  -Follow up in 1 month for physical. Will obtain labs at that visit.

## 2023-05-30 ENCOUNTER — Other Ambulatory Visit (HOSPITAL_COMMUNITY): Payer: Self-pay

## 2023-05-30 ENCOUNTER — Ambulatory Visit: Payer: 59 | Admitting: Family Medicine

## 2023-05-31 ENCOUNTER — Other Ambulatory Visit: Payer: Self-pay

## 2023-06-07 DIAGNOSIS — F411 Generalized anxiety disorder: Secondary | ICD-10-CM | POA: Diagnosis not present

## 2023-06-07 DIAGNOSIS — F331 Major depressive disorder, recurrent, moderate: Secondary | ICD-10-CM | POA: Diagnosis not present

## 2023-06-07 DIAGNOSIS — F900 Attention-deficit hyperactivity disorder, predominantly inattentive type: Secondary | ICD-10-CM | POA: Diagnosis not present

## 2023-06-08 ENCOUNTER — Other Ambulatory Visit: Payer: Self-pay

## 2023-06-23 DIAGNOSIS — Z13220 Encounter for screening for lipoid disorders: Secondary | ICD-10-CM | POA: Diagnosis not present

## 2023-06-23 DIAGNOSIS — E6609 Other obesity due to excess calories: Secondary | ICD-10-CM | POA: Diagnosis not present

## 2023-06-23 DIAGNOSIS — E559 Vitamin D deficiency, unspecified: Secondary | ICD-10-CM | POA: Diagnosis not present

## 2023-06-23 DIAGNOSIS — R21 Rash and other nonspecific skin eruption: Secondary | ICD-10-CM | POA: Diagnosis not present

## 2023-06-23 DIAGNOSIS — Z1329 Encounter for screening for other suspected endocrine disorder: Secondary | ICD-10-CM | POA: Diagnosis not present

## 2023-06-23 DIAGNOSIS — Z23 Encounter for immunization: Secondary | ICD-10-CM | POA: Diagnosis not present

## 2023-06-23 DIAGNOSIS — R7303 Prediabetes: Secondary | ICD-10-CM | POA: Diagnosis not present

## 2023-06-23 DIAGNOSIS — E66811 Obesity, class 1: Secondary | ICD-10-CM | POA: Diagnosis not present

## 2023-06-23 DIAGNOSIS — E611 Iron deficiency: Secondary | ICD-10-CM | POA: Diagnosis not present

## 2023-06-23 DIAGNOSIS — Z0000 Encounter for general adult medical examination without abnormal findings: Secondary | ICD-10-CM | POA: Diagnosis not present

## 2023-06-27 ENCOUNTER — Other Ambulatory Visit (HOSPITAL_COMMUNITY): Payer: Self-pay

## 2023-06-27 MED ORDER — OZEMPIC (0.25 OR 0.5 MG/DOSE) 2 MG/3ML ~~LOC~~ SOPN
0.5000 mg | PEN_INJECTOR | SUBCUTANEOUS | 1 refills | Status: DC
  Filled 2023-06-27: qty 3, 28d supply, fill #0

## 2023-06-28 ENCOUNTER — Other Ambulatory Visit (HOSPITAL_COMMUNITY): Payer: Self-pay

## 2023-06-28 DIAGNOSIS — F900 Attention-deficit hyperactivity disorder, predominantly inattentive type: Secondary | ICD-10-CM | POA: Diagnosis not present

## 2023-06-28 DIAGNOSIS — F411 Generalized anxiety disorder: Secondary | ICD-10-CM | POA: Diagnosis not present

## 2023-06-28 DIAGNOSIS — F331 Major depressive disorder, recurrent, moderate: Secondary | ICD-10-CM | POA: Diagnosis not present

## 2023-07-03 ENCOUNTER — Encounter (HOSPITAL_COMMUNITY): Payer: Self-pay | Admitting: Psychiatry

## 2023-07-03 ENCOUNTER — Telehealth (HOSPITAL_COMMUNITY): Payer: 59 | Admitting: Psychiatry

## 2023-07-03 ENCOUNTER — Other Ambulatory Visit (HOSPITAL_COMMUNITY): Payer: Self-pay

## 2023-07-03 DIAGNOSIS — F331 Major depressive disorder, recurrent, moderate: Secondary | ICD-10-CM | POA: Diagnosis not present

## 2023-07-03 DIAGNOSIS — F3130 Bipolar disorder, current episode depressed, mild or moderate severity, unspecified: Secondary | ICD-10-CM

## 2023-07-03 DIAGNOSIS — F411 Generalized anxiety disorder: Secondary | ICD-10-CM

## 2023-07-03 DIAGNOSIS — R5383 Other fatigue: Secondary | ICD-10-CM

## 2023-07-03 MED ORDER — ESCITALOPRAM OXALATE 20 MG PO TABS
20.0000 mg | ORAL_TABLET | Freq: Every day | ORAL | 0 refills | Status: DC
  Filled 2023-07-03: qty 90, 90d supply, fill #0

## 2023-07-03 MED ORDER — ARIPIPRAZOLE 5 MG PO TABS
5.0000 mg | ORAL_TABLET | Freq: Every day | ORAL | 2 refills | Status: DC
  Filled 2023-07-03: qty 30, 30d supply, fill #0
  Filled 2023-08-21: qty 30, 30d supply, fill #1
  Filled 2023-09-17: qty 30, 30d supply, fill #2

## 2023-07-03 NOTE — Progress Notes (Signed)
 [facility] Follow up visit  Patient Identification: Kellie Kline MRN:  [id number] Date of Evaluation:  07/03/2023 Referral Source: primary care Chief Complaint:   No chief complaint on file.  Follow up depression, anxiety   Visit Diagnosis:    ICD-10-CM   1. Bipolar I disorder, most recent episode depressed (HCC)  F31.30     2. GAD (generalized anxiety disorder)  F41.1     3. MDD (major depressive disorder), recurrent episode, moderate (HCC)  F33.1     4. Tiredness  R53.83      MDD recurrent, rule out bipolar depressed GAD,  Tiredness  Virtual Visit via Video Note  I connected with Clyda Alaimo on 07/03/23 at  4:00 PM EDT by a video enabled telemedicine application and verified that I am speaking with the correct person using two identifiers.  Location: Patient: home Provider: home office   I discussed the limitations of evaluation and management by telemedicine and the availability of in person appointments. The patient expressed understanding and agreed to proceed.       I discussed the assessment and treatment plan with the patient. The patient was provided an opportunity to ask questions and all were answered. The patient agreed with the plan and demonstrated an understanding of the instructions.   The patient was advised to call back or seek an in-person evaluation if the symptoms worsen or if the condition fails to improve as anticipated.  I provided 18 minutes of non-face-to-face time during this encounter.      History of Present Illness: Patient is a 23 years old currently single African-American female who is living with her adoptive mom, sister  initially by primary care physician to establish care and transfer from [facility].   She has transferred to outpatient clinic with [facility] , likes her job Was doing fair last visit but noticing tiredness, some amotivation Overall doesn't feel depressed on day to day basis Does snore at night  No tremors Labs  being followed by PCP  Aggravating factors: biological parents interference when young, some job stress Modifying factor; mom, sister  Duration since young age Severity manageable but noticing tiredness Hospital admission denies Suicide attempt denies Drug use denies   Past Psychiatric History: depression, anxiety  Previous Psychotropic Medications: No   Substance Abuse History in the last 12 months:  No.  Consequences of Substance Abuse: NA  Past Medical History:  Past Medical History:  Diagnosis Date   Allergy    Anemia    Anxiety    Depression    Diabetes mellitus without complication (HCC)    Heart murmur    PCOS (polycystic ovarian syndrome)    Vitamin D deficiency     Past Surgical History:  Procedure Laterality Date   TONSILLECTOMY     TYMPANOSTOMY TUBE PLACEMENT      Family Psychiatric History: biological parents not sure, maybe grand ma had some sickness  Family History:  Family History  Problem Relation Age of Onset   ADD / ADHD Mother    Anxiety disorder Mother    Depression Mother    Diabetes Mother    Obesity Mother     Social History:   Social History   Socioeconomic History   Marital status: Single    Spouse name: Not on file   Number of children: 0   Years of education: Not on file   Highest education level: Some college, no degree  Occupational History   Not on file  Tobacco Use  Smoking status: Never   Smokeless tobacco: Never   Tobacco comments:    Never smoked before in my life  Vaping Use   Vaping status: Never Used  Substance and Sexual Activity   Alcohol use: Never   Drug use: Never   Sexual activity: Never  Other Topics Concern   Not on file  Social History Narrative   Not on file   Social Drivers of Health   Financial Resource Strain: Low Risk  (05/23/2023)   Overall Financial Resource Strain (CARDIA)    Difficulty of Paying Living Expenses: Not very hard  Food Insecurity: Food Insecurity Present (05/23/2023)    Hunger Vital Sign    Worried About Running Out of Food in the Last Year: Sometimes true    Ran Out of Food in the Last Year: Sometimes true  Transportation Needs: No Transportation Needs (05/23/2023)   PRAPARE - Administrator, Civil Service (Medical): No    Lack of Transportation (Non-Medical): No  Physical Activity: Inactive (05/24/2023)   Exercise Vital Sign    Days of Exercise per Week: 0 days    Minutes of Exercise per Session: 0 min  Stress: Stress Concern Present (05/23/2023)   [employer] of Occupational Health - Occupational Stress Questionnaire    Feeling of Stress : To some extent  Social Connections: Moderately Integrated (05/23/2023)   Social Connection and Isolation Panel [NHANES]    Frequency of Communication with Friends and Family: More than three times a week    Frequency of Social Gatherings with Friends and Family: Once a week    Attends Religious Services: More than 4 times per year    Active Member of [employer] or Organizations: Yes    Attends Engineer, structural: More than 4 times per year    Marital Status: Never married    Additional Social History: grew up with adapted parents, would get upset when biological parents would come in her life and control, emotional concerns  Allergies:   Allergies  Allergen Reactions   Lactose Intolerance (Gi)     Metabolic Disorder Labs: No results found for: "HGBA1C", "MPG" No results found for: "PROLACTIN" No results found for: "CHOL", "TRIG", "HDL", "CHOLHDL", "VLDL", "LDLCALC" No results found for: "TSH"  Therapeutic Level Labs: No results found for: "LITHIUM" No results found for: "CBMZ" No results found for: "VALPROATE"  Current Medications: Current Outpatient Medications  Medication Sig Dispense Refill   ARIPiprazole (ABILIFY) 5 MG tablet Take 1 tablet (5 mg total) by mouth daily. 30 tablet 2   augmented betamethasone dipropionate (DIPROLENE-AF) 0.05 % ointment Apply a small amount to skin  twice a day 50 g 3   desmopressin (DDAVP) 0.2 MG tablet Take 2 tablets by mouth daily for 1 week.  After 2 weeks if no improvement, increase to 3 tablets daily as directed. 180 tablet 3   desogestrel-ethinyl estradiol (APRI) 0.15-30 MG-MCG tablet Take 1 tablet by mouth daily. 84 tablet 4   escitalopram (LEXAPRO) 20 MG tablet Take 1 tablet (20 mg) by mouth at bedtime. 90 tablet 0   ferrous sulfate 325 (65 FE) MG tablet Take 325 mg by mouth daily with breakfast.     metFORMIN (GLUCOPHAGE-XR) 500 MG 24 hr tablet Take 1 tablet (500 mg total) by mouth daily with breakfast. 90 tablet 1   ondansetron (ZOFRAN) 4 MG tablet Take 1 tablet (4 mg total) by mouth every 12 (twelve) hours as needed for nausea or vomiting. 30 tablet 0   Semaglutide,0.25 or  0.5MG /DOS, (OZEMPIC, 0.25 OR 0.5 MG/DOSE,) 2 MG/3ML SOPN Inject 0.5 mg into the skin every 7 (seven) days. 3 mL 1   tacrolimus (PROTOPIC) 0.1 % ointment Apply a small amount to affected area twice a day 30 g 11   Vitamin D, Ergocalciferol, (DRISDOL) 1.25 MG (50000 UNIT) CAPS capsule Take 1 capsule (50,000 Units total) by mouth once a week. 4 capsule 5   No current facility-administered medications for this visit.    Psychiatric Specialty Exam: Review of Systems  Cardiovascular:  Negative for chest pain.  Psychiatric/Behavioral:  Negative for agitation, dysphoric mood and sleep disturbance.     There were no vitals taken for this visit.There is no height or weight on file to calculate BMI.  General Appearance: Casual  Eye Contact:  Fair  Speech:  Clear and Coherent  Volume:  Normal  Mood:  fair  Affect:  Constricted  Thought Process:  Goal Directed  Orientation:  Full (Time, Place, and Person)  Thought Content:  Logical  Suicidal Thoughts:  No  Homicidal Thoughts:  No  Memory:  Immediate;   Fair  Judgement:  Fair  Insight:  Fair  Psychomotor Activity:  Normal  Concentration:  Concentration: Fair  Recall:  Fair  Fund of Knowledge:Good  Language:  Good  Akathisia:  No  Handed:    AIMS (if indicated):  no involuntary movement  Assets:  Desire for Improvement Housing  ADL's:  Intact  Cognition: WNL  Sleep:  Fair   Screenings: GAD-7    Garment/textile technologist Visit from 05/24/2023 in [facility] [place]  Total GAD-7 Score 9      PHQ2-9    Flowsheet Row Office Visit from 05/24/2023 in [facility] [place] Nutrition from 12/29/2022 in [facility]  - A Dept Of [place] Office Visit from 05/14/2022 in [facility] from 01/18/2020 in [facility] Department at [facility]  PHQ-2 Total Score 2 0 1 2  PHQ-9 Total Score 18 -- -- 6      Flowsheet Row [facility] from 01/18/2020 in [facility] Emergency Department at [facility]  C-SSRS RISK CATEGORY No Risk Error: Q2 is Yes, you must answer 3, 4, and 5       Assessment and Plan: as follows  Prior documentation reviewed   MDD recurrent moderate:  fair some tiredness, continue lexapro, abilify  No clear history of manic episodes,  GAD: manageable continue lexapro Insomnia: Tiredness: ongoing, also snores, reviewed sleep apnea possibility and reviewed sleep hygiene, she agrees to get evaluated  Can connect with PCp or sleep clinic  If needed call earlier for any worsening of depression or concerns  Fu 3 m.  Collaboration of Care: Primary Care Provider AEB reviewed notes and referral  Patient/Guardian was advised Release of Information must be obtained prior to any record release in order to collaborate their care with an outside provider. Patient/Guardian was advised if they have not already done so to contact the registration department to sign all necessary forms in order for [place] to release information regarding their care.   Consent: Patient/Guardian gives  verbal consent for treatment and assignment of benefits for services provided during this visit. Patient/Guardian expressed understanding and agreed to proceed.   Thresa Ross, MD 3/17/20253:57 PM

## 2023-07-05 ENCOUNTER — Other Ambulatory Visit (HOSPITAL_COMMUNITY): Payer: Self-pay

## 2023-07-11 ENCOUNTER — Other Ambulatory Visit (HOSPITAL_COMMUNITY): Payer: Self-pay

## 2023-07-11 MED ORDER — TRULICITY 3 MG/0.5ML ~~LOC~~ SOAJ
SUBCUTANEOUS | 1 refills | Status: DC
Start: 2023-07-10 — End: 2023-08-08
  Filled 2023-07-11: qty 2, 28d supply, fill #0

## 2023-07-12 ENCOUNTER — Encounter: Payer: 59 | Admitting: Family Medicine

## 2023-07-19 DIAGNOSIS — F411 Generalized anxiety disorder: Secondary | ICD-10-CM | POA: Diagnosis not present

## 2023-07-19 DIAGNOSIS — F331 Major depressive disorder, recurrent, moderate: Secondary | ICD-10-CM | POA: Diagnosis not present

## 2023-07-19 DIAGNOSIS — F900 Attention-deficit hyperactivity disorder, predominantly inattentive type: Secondary | ICD-10-CM | POA: Diagnosis not present

## 2023-07-31 DIAGNOSIS — G4730 Sleep apnea, unspecified: Secondary | ICD-10-CM | POA: Diagnosis not present

## 2023-08-08 ENCOUNTER — Other Ambulatory Visit (HOSPITAL_COMMUNITY): Payer: Self-pay

## 2023-08-08 DIAGNOSIS — Z713 Dietary counseling and surveillance: Secondary | ICD-10-CM | POA: Diagnosis not present

## 2023-08-08 DIAGNOSIS — E559 Vitamin D deficiency, unspecified: Secondary | ICD-10-CM | POA: Diagnosis not present

## 2023-08-08 DIAGNOSIS — Z6837 Body mass index (BMI) 37.0-37.9, adult: Secondary | ICD-10-CM | POA: Diagnosis not present

## 2023-08-08 DIAGNOSIS — R7303 Prediabetes: Secondary | ICD-10-CM | POA: Diagnosis not present

## 2023-08-08 DIAGNOSIS — F319 Bipolar disorder, unspecified: Secondary | ICD-10-CM | POA: Diagnosis not present

## 2023-08-08 DIAGNOSIS — E66812 Obesity, class 2: Secondary | ICD-10-CM | POA: Diagnosis not present

## 2023-08-08 MED ORDER — PHENTERMINE HCL 37.5 MG PO TABS
18.7500 mg | ORAL_TABLET | Freq: Every day | ORAL | 0 refills | Status: DC
  Filled 2023-08-08: qty 15, 30d supply, fill #0

## 2023-08-09 ENCOUNTER — Other Ambulatory Visit: Payer: Self-pay

## 2023-08-16 DIAGNOSIS — F900 Attention-deficit hyperactivity disorder, predominantly inattentive type: Secondary | ICD-10-CM | POA: Diagnosis not present

## 2023-08-16 DIAGNOSIS — F411 Generalized anxiety disorder: Secondary | ICD-10-CM | POA: Diagnosis not present

## 2023-08-16 DIAGNOSIS — F331 Major depressive disorder, recurrent, moderate: Secondary | ICD-10-CM | POA: Diagnosis not present

## 2023-08-22 ENCOUNTER — Other Ambulatory Visit (HOSPITAL_COMMUNITY): Payer: Self-pay

## 2023-08-24 DIAGNOSIS — R768 Other specified abnormal immunological findings in serum: Secondary | ICD-10-CM | POA: Diagnosis not present

## 2023-09-18 ENCOUNTER — Other Ambulatory Visit (HOSPITAL_COMMUNITY): Payer: Self-pay

## 2023-09-20 DIAGNOSIS — F900 Attention-deficit hyperactivity disorder, predominantly inattentive type: Secondary | ICD-10-CM | POA: Diagnosis not present

## 2023-09-20 DIAGNOSIS — F331 Major depressive disorder, recurrent, moderate: Secondary | ICD-10-CM | POA: Diagnosis not present

## 2023-09-20 DIAGNOSIS — F411 Generalized anxiety disorder: Secondary | ICD-10-CM | POA: Diagnosis not present

## 2023-10-02 ENCOUNTER — Other Ambulatory Visit (HOSPITAL_COMMUNITY): Payer: Self-pay

## 2023-10-02 ENCOUNTER — Telehealth (HOSPITAL_COMMUNITY): Payer: Self-pay | Admitting: *Deleted

## 2023-10-02 ENCOUNTER — Telehealth (INDEPENDENT_AMBULATORY_CARE_PROVIDER_SITE_OTHER): Admitting: Psychiatry

## 2023-10-02 ENCOUNTER — Encounter (HOSPITAL_COMMUNITY): Payer: Self-pay | Admitting: Psychiatry

## 2023-10-02 DIAGNOSIS — R5383 Other fatigue: Secondary | ICD-10-CM | POA: Diagnosis not present

## 2023-10-02 DIAGNOSIS — F411 Generalized anxiety disorder: Secondary | ICD-10-CM | POA: Diagnosis not present

## 2023-10-02 DIAGNOSIS — F3130 Bipolar disorder, current episode depressed, mild or moderate severity, unspecified: Secondary | ICD-10-CM

## 2023-10-02 MED ORDER — ESCITALOPRAM OXALATE 20 MG PO TABS
20.0000 mg | ORAL_TABLET | Freq: Every day | ORAL | 0 refills | Status: DC
  Filled 2023-10-02: qty 90, 90d supply, fill #0

## 2023-10-02 MED ORDER — METFORMIN HCL ER 500 MG PO TB24: 500.0000 mg | ORAL_TABLET | Freq: Every day | ORAL | 1 refills | Status: DC

## 2023-10-02 MED ORDER — WEGOVY 0.25 MG/0.5ML ~~LOC~~ SOAJ
0.5000 mL | SUBCUTANEOUS | 1 refills | Status: DC
  Filled 2023-10-02 – 2023-10-03 (×2): qty 1.5, 21d supply, fill #0

## 2023-10-02 MED ORDER — ARIPIPRAZOLE 5 MG PO TABS
5.0000 mg | ORAL_TABLET | Freq: Every day | ORAL | 0 refills | Status: DC
  Filled 2023-10-02 – 2023-10-25 (×2): qty 90, 90d supply, fill #0

## 2023-10-02 NOTE — Telephone Encounter (Signed)
 MY CHART not showing appt  requested provider calls her

## 2023-10-02 NOTE — Telephone Encounter (Signed)
 Patient said she didn't HEAR from the provider  for her 4:30 appt 10/02/23 Needs to speak with  provider

## 2023-10-02 NOTE — Progress Notes (Signed)
 [facility] Follow up visit  Patient Identification: Kellie Kline MRN:  [id number] Date of Evaluation:  10/02/2023 Referral Source: primary care Chief Complaint:   No chief complaint on file.  Follow up depression, anxiety   Visit Diagnosis:    ICD-10-CM   1. Bipolar I disorder, most recent episode depressed (HCC)  F31.30     2. GAD (generalized anxiety disorder)  F41.1     3. Tiredness  R53.83      MDD recurrent, rule out bipolar depressed GAD,  Tiredness  Virtual Visit via Video Note  I connected with Braley Polizzi on 10/02/23 at  4:30 PM EDT by a video enabled telemedicine application and verified that I am speaking with the correct person using two identifiers.  Location: Patient: home Provider: home office   I discussed the limitations of evaluation and management by telemedicine and the availability of in person appointments. The patient expressed understanding and agreed to proceed.      I discussed the assessment and treatment plan with the patient. The patient was provided an opportunity to ask questions and all were answered. The patient agreed with the plan and demonstrated an understanding of the instructions.   The patient was advised to call back or seek an in-person evaluation if the symptoms worsen or if the condition fails to improve as anticipated.  I provided 18 minutes of non-face-to-face time during this encounter.       History of Present Illness: Patient is a 23 years old currently single African-American female who is living with her adoptive mom, sister  initially by primary care physician to establish care and transfer from [facility].   Patient doing reasonable, depression, mood and anxiety is mangeable Working on [employer]  Labs reviewed and follows with PCP HBa1c is up and being managed  with metformin  Planning for weight loss med  No tremors  Aggravating factors: biological parents interference when young, some job  stress Modifying factor; mom, sister  Severity better    Past Psychiatric History: depression, anxiety  Previous Psychotropic Medications: No   Substance Abuse History in the last 12 months:  No.  Consequences of Substance Abuse: NA  Past Medical History:  Past Medical History:  Diagnosis Date   Allergy    Anemia    Anxiety    Depression    Diabetes mellitus without complication (HCC)    Heart murmur    PCOS (polycystic ovarian syndrome)    Vitamin D  deficiency     Past Surgical History:  Procedure Laterality Date   TONSILLECTOMY     TYMPANOSTOMY TUBE PLACEMENT      Family Psychiatric History: biological parents not sure, maybe grand ma had some sickness  Family History:  Family History  Problem Relation Age of Onset   ADD / ADHD Mother    Anxiety disorder Mother    Depression Mother    Diabetes Mother    Obesity Mother     Social History:   Social History   Socioeconomic History   Marital status: Single    Spouse name: Not on file   Number of children: 0   Years of education: Not on file   Highest education level: Some college, no degree  Occupational History   Not on file  Tobacco Use   Smoking status: Never   Smokeless tobacco: Never   Tobacco comments:    Never smoked before in my life  Vaping Use   Vaping status: Never Used  Substance and Sexual Activity  Alcohol use: Never   Drug use: Never   Sexual activity: Never  Other Topics Concern   Not on file  Social History Narrative   Not on file   Social Drivers of Health   Financial Resource Strain: Low Risk  (05/23/2023)   Overall Financial Resource Strain (CARDIA)    Difficulty of Paying Living Expenses: Not very hard  Food Insecurity: Food Insecurity Present (05/23/2023)   Hunger Vital Sign    Worried About Running Out of Food in the Last Year: Sometimes true    Ran Out of Food in the Last Year: Sometimes true  Transportation Needs: No Transportation Needs (05/23/2023)   PRAPARE -  Administrator, Civil Service (Medical): No    Lack of Transportation (Non-Medical): No  Physical Activity: Inactive (05/24/2023)   Exercise Vital Sign    Days of Exercise per Week: 0 days    Minutes of Exercise per Session: 0 min  Stress: Stress Concern Present (05/23/2023)   [employer] of Occupational Health - Occupational Stress Questionnaire    Feeling of Stress : To some extent  Social Connections: Moderately Integrated (05/23/2023)   Social Connection and Isolation Panel    Frequency of Communication with Friends and Family: More than three times a week    Frequency of Social Gatherings with Friends and Family: Once a week    Attends Religious Services: More than 4 times per year    Active Member of [employer] or Organizations: Yes    Attends Engineer, structural: More than 4 times per year    Marital Status: Never married    Additional Social History: grew up with adapted parents, would get upset when biological parents would come in her life and control, emotional concerns  Allergies:   Allergies  Allergen Reactions   Lactose Intolerance (Gi)     Metabolic Disorder Labs: No results found for: HGBA1C, MPG No results found for: PROLACTIN No results found for: CHOL, TRIG, HDL, CHOLHDL, VLDL, LDLCALC No results found for: TSH  Therapeutic Level Labs: No results found for: LITHIUM No results found for: CBMZ No results found for: VALPROATE  Current Medications: Current Outpatient Medications  Medication Sig Dispense Refill   ARIPiprazole  (ABILIFY ) 5 MG tablet Take 1 tablet (5 mg total) by mouth daily. 90 tablet 0   augmented betamethasone  dipropionate (DIPROLENE -AF) 0.05 % ointment Apply a small amount to skin twice a day 50 g 3   desmopressin  (DDAVP ) 0.2 MG tablet Take 2 tablets by mouth daily for 1 week.  After 2 weeks if no improvement, increase to 3 tablets daily as directed. 180 tablet 3   desogestrel -ethinyl estradiol   (APRI ) 0.15-30 MG-MCG tablet Take 1 tablet by mouth daily. 84 tablet 4   escitalopram  (LEXAPRO ) 20 MG tablet Take 1 tablet (20 mg total) by mouth at bedtime. 90 tablet 0   ferrous sulfate 325 (65 FE) MG tablet Take 325 mg by mouth daily with breakfast.     metFORMIN  (GLUCOPHAGE -XR) 500 MG 24 hr tablet Take 1 tablet (500 mg total) by mouth daily with breakfast. 90 tablet 1   ondansetron  (ZOFRAN ) 4 MG tablet Take 1 tablet (4 mg total) by mouth every 12 (twelve) hours as needed for nausea or vomiting. 30 tablet 0   Semaglutide -Weight Management (WEGOVY ) 0.25 MG/0.5ML SOAJ Inject 0.25 mg into the skin once a week. 1.5 mL 1   tacrolimus  (PROTOPIC ) 0.1 % ointment Apply a small amount to affected area twice a day 30 g 11  No current facility-administered medications for this visit.    Psychiatric Specialty Exam: Review of Systems  Cardiovascular:  Negative for chest pain.  Psychiatric/Behavioral:  Negative for agitation, dysphoric mood and sleep disturbance.     There were no vitals taken for this visit.There is no height or weight on file to calculate BMI.  General Appearance: Casual  Eye Contact:  Fair  Speech:  Clear and Coherent  Volume:  Normal  Mood:  fair  Affect:  Constricted  Thought Process:  Goal Directed  Orientation:  Full (Time, Place, and Person)  Thought Content:  Logical  Suicidal Thoughts:  No  Homicidal Thoughts:  No  Memory:  Immediate;   Fair  Judgement:  Fair  Insight:  Fair  Psychomotor Activity:  Normal  Concentration:  Concentration: Fair  Recall:  Fair  Fund of Knowledge:Good  Language: Good  Akathisia:  No  Handed:    AIMS (if indicated):  no involuntary movement  Assets:  Desire for Improvement Housing  ADL's:  Intact  Cognition: WNL  Sleep:  Fair   Screenings: GAD-7    Garment/textile technologist Visit from [facility]/24/2023 in [facility] [place]  Total GAD-7 Score 9   PHQ2-9    Flowsheet Row Office Visit from 05/24/2023 in [facility] [place] Nutrition from 12/29/2022 in [facility]  - A Dept Of [facility] Office Visit from 05[facility]14/2022 in [facility] from 01/18/2020 in [facility] at [facility]  PHQ-2 Total Score 2 0 1 2  PHQ-9 Total Score 18 -- -- 6   Flowsheet Row [facility] from 01/18/2020 in [facility] Department at [facility]  C-SSRS RISK CATEGORY No Risk Error: Q2 is Yes, you must answer 3, 4, and 5    Assessment and Plan: as follows Prior documentation reviewed   MDD recurrent moderate: better continue abilify  small dose, lexapro    No clear history of manic episodes,  GAD: manageable continue lexapro   Insomnia:  better, also tiredness improving, continue sleep hygiene  FU 6 m. Renewed meds  Collaboration of Care: Primary Care Provider AEB reviewed notes and referral  Patient/Guardian was advised Release of Information must be obtained prior to any record release in order to collaborate their care with an outside provider. Patient/Guardian was advised if they have not already done so to contact the registration department to sign all necessary forms in order for us  to release information regarding their care.   Consent: Patient/Guardian gives verbal consent for treatment and assignment of benefits for services provided during this visit. Patient/Guardian expressed understanding and agreed to proceed.   Wray Heady, MD 6/16/20254:32 PM

## 2023-10-03 ENCOUNTER — Telehealth (HOSPITAL_COMMUNITY): Payer: Self-pay | Admitting: *Deleted

## 2023-10-03 ENCOUNTER — Other Ambulatory Visit: Payer: Self-pay

## 2023-10-03 ENCOUNTER — Other Ambulatory Visit (HOSPITAL_COMMUNITY): Payer: Self-pay

## 2023-10-03 NOTE — Telephone Encounter (Signed)
 Yesterday she had a 4:30 appt scheduled

## 2023-10-03 NOTE — Telephone Encounter (Signed)
 Patient appt on  10/03/23 was reaching out to provider  Last appt  was 07/03/23  Attempted call LVM

## 2023-10-06 ENCOUNTER — Other Ambulatory Visit (HOSPITAL_COMMUNITY): Payer: Self-pay

## 2023-10-11 DIAGNOSIS — F411 Generalized anxiety disorder: Secondary | ICD-10-CM | POA: Diagnosis not present

## 2023-10-11 DIAGNOSIS — F331 Major depressive disorder, recurrent, moderate: Secondary | ICD-10-CM | POA: Diagnosis not present

## 2023-10-11 DIAGNOSIS — F900 Attention-deficit hyperactivity disorder, predominantly inattentive type: Secondary | ICD-10-CM | POA: Diagnosis not present

## 2023-10-26 ENCOUNTER — Other Ambulatory Visit: Payer: Self-pay

## 2023-10-26 ENCOUNTER — Other Ambulatory Visit (HOSPITAL_COMMUNITY): Payer: Self-pay

## 2023-11-10 DIAGNOSIS — F331 Major depressive disorder, recurrent, moderate: Secondary | ICD-10-CM | POA: Diagnosis not present

## 2023-11-10 DIAGNOSIS — F900 Attention-deficit hyperactivity disorder, predominantly inattentive type: Secondary | ICD-10-CM | POA: Diagnosis not present

## 2023-11-10 DIAGNOSIS — F411 Generalized anxiety disorder: Secondary | ICD-10-CM | POA: Diagnosis not present

## 2023-11-17 DIAGNOSIS — R948 Abnormal results of function studies of other organs and systems: Secondary | ICD-10-CM | POA: Diagnosis not present

## 2023-11-17 DIAGNOSIS — J069 Acute upper respiratory infection, unspecified: Secondary | ICD-10-CM | POA: Diagnosis not present

## 2023-11-17 DIAGNOSIS — R635 Abnormal weight gain: Secondary | ICD-10-CM | POA: Diagnosis not present

## 2023-11-27 DIAGNOSIS — E119 Type 2 diabetes mellitus without complications: Secondary | ICD-10-CM | POA: Diagnosis not present

## 2023-11-27 DIAGNOSIS — H53143 Visual discomfort, bilateral: Secondary | ICD-10-CM | POA: Diagnosis not present

## 2023-12-06 DIAGNOSIS — F331 Major depressive disorder, recurrent, moderate: Secondary | ICD-10-CM | POA: Diagnosis not present

## 2023-12-06 DIAGNOSIS — F411 Generalized anxiety disorder: Secondary | ICD-10-CM | POA: Diagnosis not present

## 2023-12-06 DIAGNOSIS — F900 Attention-deficit hyperactivity disorder, predominantly inattentive type: Secondary | ICD-10-CM | POA: Diagnosis not present

## 2023-12-07 ENCOUNTER — Other Ambulatory Visit (HOSPITAL_COMMUNITY): Payer: Self-pay

## 2023-12-07 ENCOUNTER — Encounter (HOSPITAL_COMMUNITY): Payer: Self-pay

## 2023-12-07 DIAGNOSIS — E282 Polycystic ovarian syndrome: Secondary | ICD-10-CM | POA: Diagnosis not present

## 2023-12-07 DIAGNOSIS — R748 Abnormal levels of other serum enzymes: Secondary | ICD-10-CM | POA: Diagnosis not present

## 2023-12-07 DIAGNOSIS — R7303 Prediabetes: Secondary | ICD-10-CM | POA: Diagnosis not present

## 2023-12-07 DIAGNOSIS — Z6837 Body mass index (BMI) 37.0-37.9, adult: Secondary | ICD-10-CM | POA: Diagnosis not present

## 2023-12-07 DIAGNOSIS — E66812 Obesity, class 2: Secondary | ICD-10-CM | POA: Diagnosis not present

## 2023-12-07 MED ORDER — ZEPBOUND 2.5 MG/0.5ML ~~LOC~~ SOAJ
2.5000 mg | SUBCUTANEOUS | 0 refills | Status: AC
  Filled 2023-12-07 – 2023-12-08 (×3): qty 2, 28d supply, fill #0

## 2023-12-08 ENCOUNTER — Other Ambulatory Visit (HOSPITAL_COMMUNITY): Payer: Self-pay

## 2023-12-20 ENCOUNTER — Other Ambulatory Visit: Payer: Self-pay

## 2023-12-20 ENCOUNTER — Emergency Department (HOSPITAL_BASED_OUTPATIENT_CLINIC_OR_DEPARTMENT_OTHER)
Admission: EM | Admit: 2023-12-20 | Discharge: 2023-12-20 | Disposition: A | Discharge status: Home / Self Care | Attending: Emergency Medicine | Admitting: Emergency Medicine

## 2023-12-20 ENCOUNTER — Emergency Department (HOSPITAL_BASED_OUTPATIENT_CLINIC_OR_DEPARTMENT_OTHER)

## 2023-12-20 DIAGNOSIS — N83202 Unspecified ovarian cyst, left side: Secondary | ICD-10-CM | POA: Insufficient documentation

## 2023-12-20 DIAGNOSIS — R1031 Right lower quadrant pain: Secondary | ICD-10-CM | POA: Diagnosis not present

## 2023-12-20 DIAGNOSIS — R160 Hepatomegaly, not elsewhere classified: Secondary | ICD-10-CM | POA: Diagnosis not present

## 2023-12-20 DIAGNOSIS — E119 Type 2 diabetes mellitus without complications: Secondary | ICD-10-CM | POA: Diagnosis not present

## 2023-12-20 DIAGNOSIS — N83201 Unspecified ovarian cyst, right side: Secondary | ICD-10-CM | POA: Diagnosis not present

## 2023-12-20 DIAGNOSIS — Z7984 Long term (current) use of oral hypoglycemic drugs: Secondary | ICD-10-CM | POA: Insufficient documentation

## 2023-12-20 DIAGNOSIS — R1032 Left lower quadrant pain: Secondary | ICD-10-CM | POA: Diagnosis present

## 2023-12-20 LAB — COMPREHENSIVE METABOLIC PANEL WITH GFR
ALT: 28 U/L (ref 0–44)
AST: 23 U/L (ref 15–41)
Albumin: 4.7 g/dL (ref 3.5–5.0)
Alkaline Phosphatase: 56 U/L (ref 38–126)
Anion gap: 14 (ref 5–15)
BUN: 13 mg/dL (ref 6–20)
CO2: 22 mmol/L (ref 22–32)
Calcium: 10 mg/dL (ref 8.9–10.3)
Chloride: 101 mmol/L (ref 98–111)
Creatinine, Ser: 0.94 mg/dL (ref 0.44–1.00)
GFR, Estimated: >60 mL/min (ref >60)
Glucose, Bld: 136 mg/dL — ABNORMAL HIGH (ref 70–99)
Potassium: 3.9 mmol/L (ref 3.5–5.1)
Sodium: 137 mmol/L (ref 135–145)
Total Bilirubin: 0.3 mg/dL (ref 0.0–1.2)
Total Protein: 8.2 g/dL — ABNORMAL HIGH (ref 6.5–8.1)

## 2023-12-20 LAB — URINALYSIS, ROUTINE W REFLEX MICROSCOPIC
Bilirubin Urine: NEGATIVE
Glucose, UA: NEGATIVE mg/dL
Hgb urine dipstick: NEGATIVE
Ketones, ur: NEGATIVE mg/dL
Leukocytes,Ua: NEGATIVE
Nitrite: NEGATIVE
Protein, ur: NEGATIVE mg/dL
Specific Gravity, Urine: 1.029 (ref 1.005–1.030)
pH: 5.5 (ref 5.0–8.0)

## 2023-12-20 LAB — CBC
HCT: 38.3 % (ref 36.0–46.0)
Hemoglobin: 12.3 g/dL (ref 12.0–15.0)
MCH: 26.8 pg (ref 26.0–34.0)
MCHC: 32.1 g/dL (ref 30.0–36.0)
MCV: 83.4 fL (ref 80.0–100.0)
Platelets: 343 K/uL (ref 150–400)
RBC: 4.59 MIL/uL (ref 3.87–5.11)
RDW: 15.5 % (ref 11.5–15.5)
WBC: 6.3 K/uL (ref 4.0–10.5)
nRBC: 0 % (ref 0.0–0.2)

## 2023-12-20 LAB — PREGNANCY, URINE: Preg Test, Ur: NEGATIVE

## 2023-12-20 LAB — LIPASE, BLOOD: Lipase: 37 U/L (ref 11–51)

## 2023-12-20 MED ORDER — IOHEXOL 300 MG/ML  SOLN
100.0000 mL | Freq: Once | INTRAMUSCULAR | Status: AC | PRN
  Administered 2023-12-20: 100 mL via INTRAVENOUS

## 2023-12-20 MED ORDER — LACTATED RINGERS IV BOLUS
1000.0000 mL | Freq: Once | INTRAVENOUS | Status: AC
  Administered 2023-12-20: 1000 mL via INTRAVENOUS

## 2023-12-20 MED ORDER — FENTANYL CITRATE PF 50 MCG/ML IJ SOSY
50.0000 ug | PREFILLED_SYRINGE | INTRAMUSCULAR | Status: DC | PRN
  Administered 2023-12-20: 50 ug via INTRAVENOUS
  Filled 2023-12-20: qty 1

## 2023-12-20 MED ORDER — ONDANSETRON HCL 4 MG/2ML IJ SOLN
4.0000 mg | INTRAMUSCULAR | Status: AC | PRN
Start: 2023-12-20 — End: 2023-12-20
  Administered 2023-12-20: 4 mg via INTRAVENOUS
  Filled 2023-12-20: qty 2

## 2023-12-20 NOTE — ED Triage Notes (Signed)
 Patient states lower abdominal pain that began this morning. Denies n/v/d. Denies pain with urination

## 2023-12-20 NOTE — Discharge Instructions (Signed)
 Your imaging was overall reassuring.  It did show evidence of an ovarian cyst on the left, this could have ruptured causing the pain that you are experiencing.  Recommend alternating Tylenol  and ibuprofen  for pain control and outpatient follow-up.

## 2023-12-20 NOTE — ED Provider Notes (Signed)
 [facility] DE[facility]MENT AT [facility] Provider Note   CSN: [id number] Arrival date & time: 12/20/23  9252     Patient presents with: Abdominal Pain   Kellie Kline is a 23 y.o. female.    Abdominal Pain    23 year old female with medical history significant for PCOS, depression, diabetes mellitus, anxiety, presenting to the emergency department with a chief complaint of abdominal pain.  The patient states that her last bowel movement was 2 days ago.  She is passing gas.  She denies any specific hard stools.  States that her last menstrual period was 1 month ago.  She denies any history of significant painful menstrual cycles.  She is not sexually active and has never had sex.  She has had no vaginal discharge or vaginal bleeding or spotting.  She states that this morning she endorsed bilateral lower abdominal cramping and pain, her abdomen was tender to palpation.  She noticed worsening focality in the right lower quadrant and this prompted her presentation to the emergency department for further evaluation.  She denies any dysuria or urinary frequency.  No nausea or vomiting.  She had a little bit of coffee this morning but otherwise has not had much to eat.  She states that she sat on the toilet and tried to have a bowel movement but was unsuccessful.  Prior to Admission medications   Medication Sig Start Date End Date Taking? Authorizing Provider  metFORMIN  (GLUCOPHAGE -XR) 500 MG 24 hr tablet Take 500 mg by mouth 2 (two) times daily. 12/12/23  Yes [provider]  ARIPiprazole  (ABILIFY ) 5 MG tablet Take 1 tablet (5 mg total) by mouth daily. 10/02/23   Geralene Kaiser, MD  augmented betamethasone  dipropionate (DIPROLENE -AF) 0.05 % ointment Apply a small amount to skin twice a day 08/19/22     desmopressin  (DDAVP ) 0.2 MG tablet Take 2 tablets by mouth daily for 1 week.  After 2 weeks if no improvement, increase to 3 tablets daily as directed. 01/24/23      desogestrel -ethinyl estradiol  (APRI ) 0.15-30 MG-MCG tablet Take 1 tablet by mouth daily. 12/05/22     escitalopram  (LEXAPRO ) 20 MG tablet Take 1 tablet (20 mg total) by mouth at bedtime. 10/02/23 01/01/24  Geralene Kaiser, MD  ferrous sulfate 325 (65 FE) MG tablet Take 325 mg by mouth daily with breakfast. 06/14/21   [provider]  ondansetron  (ZOFRAN ) 4 MG tablet Take 1 tablet (4 mg total) by mouth every 12 (twelve) hours as needed for nausea or vomiting. 04/26/23     tacrolimus  (PROTOPIC ) 0.1 % ointment Apply a small amount to affected area twice a day 08/19/22     tirzepatide  (ZEPBOUND ) 2.5 MG/0.5ML Pen Inject 2.5 mg into the skin every 7 (seven) days. 12/07/23     phentermine  (ADIPEX-P ) 37.5 MG tablet Take 0.5 tablets (18.75 mg total) by mouth daily. 08/08/23 10/02/23      Allergies: Lactose intolerance (gi)    Review of Systems  Gastrointestinal:  Positive for abdominal pain.  All other systems reviewed and are negative.   Updated Vital Signs BP 101/62   Pulse 90   Temp 99 F (37.2 C) (Oral)   Resp 16   Ht 5' 5 (1.651 m)   Wt 101.2 kg   LMP 11/27/2023   SpO2 99%   BMI 37.11 kg/m   Physical Exam Vitals and nursing note reviewed.  Constitutional:      General: She is not in acute distress.    Appearance: She is well-developed.  HENT:     Head: Normocephalic and atraumatic.  Eyes:     Conjunctiva/sclera: Conjunctivae normal.  Cardiovascular:     Rate and Rhythm: Normal rate and regular rhythm.     Heart sounds: No murmur heard. Pulmonary:     Effort: Pulmonary effort is normal. No respiratory distress.     Breath sounds: Normal breath sounds.  Abdominal:     Palpations: Abdomen is soft.     Tenderness: There is abdominal tenderness in the right lower quadrant, suprapubic area and left lower quadrant. There is guarding. Positive signs include Rovsing's sign and McBurney's sign.  Musculoskeletal:        General: No swelling.     Cervical back: Neck supple.  Skin:     General: Skin is warm and dry.     Capillary Refill: Capillary refill takes less than 2 seconds.  Neurological:     Mental Status: She is alert.  Psychiatric:        Mood and Affect: Mood normal.     (all labs ordered are listed, but only abnormal results are displayed) Labs Reviewed  COMPREHENSIVE METABOLIC PANEL WITH GFR - Abnormal; Notable for the following components:      Result Value   Glucose, Bld 136 (*)    Total Protein 8.2 (*)    All other components within normal limits  LIPASE, BLOOD  CBC  URINALYSIS, ROUTINE W REFLEX MICROSCOPIC  PREGNANCY, URINE    EKG: None  Radiology: US  PELVIC TRANSABD W/PELVIC DOPPLER Result Date: 12/20/2023 CLINICAL DATA:  [id number] Ovarian cyst [id number] Pelvic pain [id number] EXAM: TRANSABDOMINAL ULTRASOUND OF PELVIS DOPPLER ULTRASOUND OF OVARIES TECHNIQUE: Transabdominal ultrasound examination of the pelvis was performed including evaluation of the uterus, ovaries, adnexal regions, and pelvic cul-de-sac. Color and duplex Doppler ultrasound was utilized to evaluate blood flow to the ovaries. COMPARISON:  December 20, 2023 CT of the abdomen and pelvis FINDINGS: Uterus Measurements: 8 x 3.5 x 4.9 cm = volume: 73 mL. No fibroids or other mass visualized. Endometrium Thickness: 10 mm.  No focal abnormality visualized. Right ovary Measurements: 3.7 x 1.9 x 2.1 cm = volume: 8.1 mL. Normal appearance/no adnexal mass. Left ovary Measurements: 5.3 x 3.2 x 2.3 cm = volume: 19.6 mL. Similar appearance of a small left ovarian cyst measuring 3 x 1.7 x 1.4 cm. Pulsed Doppler evaluation demonstrates normal low-resistance arterial and venous waveforms in both ovaries. Other findings: The small volume free fluid in the pelvis on the prior CT was not well visualized on today's ultrasound. IMPRESSION: Similar appearance of a small left ovarian cyst, measuring 3 x 1.7 x 1.4 cm. Otherwise, no sonographic findings to suggest ovarian torsion, at this time. Electronically  Signed   By: Rogelia Myers M.D.   On: 12/20/2023 11:23   CT ABDOMEN PELVIS W CONTRAST Result Date: 12/20/2023 CLINICAL DATA:  Right lower quadrant pain. EXAM: CT ABDOMEN AND PELVIS WITH CONTRAST TECHNIQUE: Multidetector CT imaging of the abdomen and pelvis was performed using the standard protocol following bolus administration of intravenous contrast. RADIATION DOSE REDUCTION: This exam was performed according to the departmental dose-optimization program which includes automated exposure control, adjustment of the mA and/or kV according to patient size and/or use of iterative reconstruction technique. CONTRAST:  100mL OMNIPAQUE  IOHEXOL  300 MG/ML  SOLN COMPARISON:  None Available. FINDINGS: Lower chest: No acute findings. Heart is at the upper limits of normal in size. No pericardial or pleural effusion. Distal esophagus is grossly unremarkable. Lymph nodes adjacent to the distal  descending thoracic aorta are not enlarged by CT size criteria. Hepatobiliary: Liver is borderline enlarged, 18.0 cm. Liver and gallbladder are otherwise unremarkable. No biliary ductal dilatation. Pancreas: Negative. Spleen: Negative. Adrenals/Urinary Tract: Adrenal glands and kidneys are unremarkable. Ureters are decompressed. Bladder is grossly unremarkable. Stomach/Bowel: Stomach, small bowel, appendix and colon are unremarkable. Vascular/Lymphatic: Left renal vein appears to be circumaortic. Vascular structures are otherwise unremarkable. Retroperitoneal lymph nodes are not enlarged by CT size criteria. Reproductive: Uterus is visualized. 4.0 cm cystic left adnexal mass (2/71). Other: Small pelvic free fluid. Mesenteries and peritoneum are otherwise unremarkable. Musculoskeletal: None. IMPRESSION: 1. No evidence of appendicitis. 2. 4.0 cm cystic left adnexal mass, likely a benign physiologic cyst. However, in the presence of pain, pelvic ultrasound could be considered. 3. Small pelvic free fluid. 4. Borderline hepatomegaly.  Electronically Signed   By: Newell Eke M.D.   On: 12/20/2023 10:07     Procedures   Medications Ordered in the ED  fentaNYL  (SUBLIMAZE ) injection 50 mcg (50 mcg Intravenous Given 12/20/23 0811)  lactated ringers  bolus 1,000 mL (0 mLs Intravenous Stopped 12/20/23 0915)  ondansetron  (ZOFRAN ) injection 4 mg (4 mg Intravenous Given 12/20/23 0812)  iohexol  (OMNIPAQUE ) 300 MG/ML solution 100 mL (100 mLs Intravenous Contrast Given 12/20/23 0940)                                    Medical Decision Making Amount and/or Complexity of Data Reviewed Labs: ordered. Radiology: ordered.  Risk Prescription drug management.    23 year old female with medical history significant for PCOS, depression, diabetes mellitus, anxiety, presenting to the emergency department with a chief complaint of abdominal pain.  The patient states that her last bowel movement was 2 days ago.  She is passing gas.  She denies any specific hard stools.  States that her last menstrual period was 1 month ago.  She denies any history of significant painful menstrual cycles.  She is not sexually active and has never had sex.  She has had no vaginal discharge or vaginal bleeding or spotting.  She states that this morning she endorsed bilateral lower abdominal cramping and pain, her abdomen was tender to palpation.  She noticed worsening focality in the right lower quadrant and this prompted her presentation to the emergency department for further evaluation.  She denies any dysuria or urinary frequency.  No nausea or vomiting.  She had a little bit of coffee this morning but otherwise has not had much to eat.  She states that she sat on the toilet and tried to have a bowel movement but was unsuccessful.  Medical Decision Making:   Mena Simonis is a 23 y.o. female who presented to the ED today with abdominal pain, detailed above.     Complete initial physical exam performed, notably the patient  was TTP in the lower abdomen, more focal TTP  of the RLQ, positive McBurney's point tenderness and positive Rovsing sign.     Reviewed and confirmed nursing documentation for past medical history, family history, social history.    Initial Assessment:   With the patient's presentation of abdominal pain, most likely diagnosis is acute appendicitis. Other diagnoses were considered including (but not limited to) gastroenteritis, colitis, small bowel obstruction, cholecystitis, pancreatitis, nephrolithiasis, UTI, pyelonephritis, diverticulitis constipation, ruptured ectopic pregnancy, PID, ovarian torsion. These are considered less likely due to history of present illness and physical exam findings.  Initial Plan:  CBC/CMP to evaluate for underlying infectious/metabolic etiology for patient's abdominal pain  Lipase to evaluate for pancreatitis  EKG to evaluate for cardiac source of pain  CTAB/Pelvis with contrast to evaluate for structural/surgical etiology of patients' severe abdominal pain.  CT Ab/pelvis without contrast due to favored nephrolithiasis over GI etiology for patient's abdominal pain  Urinalysis and repeat physical assessment to evaluate for UTI/Pyelonpehritis  Empiric management of symptoms with escalating pain control and antiemetics as needed.   Initial Study Results:   Laboratory  All laboratory results reviewed without evidence of clinically relevant pathology.   Exceptions include: mild hyperglycemia to 136.    EKG EKG was reviewed independently. Rate, rhythm, axis, intervals all examined and without medically relevant abnormality. ST segments without concerns for elevations.    Radiology  US  PELVIC TRANSABD W/PELVIC DOPPLER Result Date: 12/20/2023 CLINICAL DATA:  [id number] Ovarian cyst [id number] Pelvic pain [id number] EXAM: TRANSABDOMINAL ULTRASOUND OF PELVIS DOPPLER ULTRASOUND OF OVARIES TECHNIQUE: Transabdominal ultrasound examination of the pelvis was performed including evaluation of the uterus, ovaries, adnexal  regions, and pelvic cul-de-sac. Color and duplex Doppler ultrasound was utilized to evaluate blood flow to the ovaries. COMPARISON:  December 20, 2023 CT of the abdomen and pelvis FINDINGS: Uterus Measurements: 8 x 3.5 x 4.9 cm = volume: 73 mL. No fibroids or other mass visualized. Endometrium Thickness: 10 mm.  No focal abnormality visualized. Right ovary Measurements: 3.7 x 1.9 x 2.1 cm = volume: 8.1 mL. Normal appearance/no adnexal mass. Left ovary Measurements: 5.3 x 3.2 x 2.3 cm = volume: 19.6 mL. Similar appearance of a small left ovarian cyst measuring 3 x 1.7 x 1.4 cm. Pulsed Doppler evaluation demonstrates normal low-resistance arterial and venous waveforms in both ovaries. Other findings: The small volume free fluid in the pelvis on the prior CT was not well visualized on today's ultrasound. IMPRESSION: Similar appearance of a small left ovarian cyst, measuring 3 x 1.7 x 1.4 cm. Otherwise, no sonographic findings to suggest ovarian torsion, at this time. Electronically Signed   By: Rogelia Myers M.D.   On: 12/20/2023 11:23   CT ABDOMEN PELVIS W CONTRAST Result Date: 12/20/2023 CLINICAL DATA:  Right lower quadrant pain. EXAM: CT ABDOMEN AND PELVIS WITH CONTRAST TECHNIQUE: Multidetector CT imaging of the abdomen and pelvis was performed using the standard protocol following bolus administration of intravenous contrast. RADIATION DOSE REDUCTION: This exam was performed according to the departmental dose-optimization program which includes automated exposure control, adjustment of the mA and/or kV according to patient size and/or use of iterative reconstruction technique. CONTRAST:  OMNIPAQUE  IOHEXOL  300 MG/ML  SOLN COMPARISON:  None Available. FINDINGS: Lower chest: No acute findings. Heart is at the upper limits of normal in size. No pericardial or pleural effusion. Distal esophagus is grossly unremarkable. Lymph nodes adjacent to the distal descending thoracic aorta are not enlarged by CT size  criteria. Hepatobiliary: Liver is borderline enlarged, 18.0 cm. Liver and gallbladder are otherwise unremarkable. No biliary ductal dilatation. Pancreas: Negative. Spleen: Negative. Adrenals/Urinary Tract: Adrenal glands and kidneys are unremarkable. Ureters are decompressed. Bladder is grossly unremarkable. Stomach/Bowel: Stomach, small bowel, appendix and colon are unremarkable. Vascular/Lymphatic: Left renal vein appears to be circumaortic. Vascular structures are otherwise unremarkable. Retroperitoneal lymph nodes are not enlarged by CT size criteria. Reproductive: Uterus is visualized. 4.0 cm cystic left adnexal mass (2/71). Other: Small pelvic free fluid. Mesenteries and peritoneum are otherwise unremarkable. Musculoskeletal: None. IMPRESSION: 1. No evidence of appendicitis. 2. 4.0 cm cystic left adnexal  mass, likely a benign physiologic cyst. However, in the presence of pain, pelvic ultrasound could be considered. 3. Small pelvic free fluid. 4. Borderline hepatomegaly. Electronically Signed   By: Newell Eke M.D.   On: 12/20/2023 10:07      Final Reassessment and Plan:   Given CT findings, Pelvic US  performed to eval for rupture cyst vs torsion.   US  Pelvis w/doppler:  IMPRESSION:  Similar appearance of a small left ovarian cyst, measuring 3 x 1.7 x  1.4 cm. Otherwise, no sonographic findings to suggest ovarian  torsion, at this time.    Pt on repeat assessment the patient was feeling symptomatically improved.  Informed her of the diagnosis of ovarian cyst.  No evidence of ovarian torsion, no other acute abnormality seen on workup.  Patient overall stable for discharge, advised Tylenol  and ibuprofen  for pain control and outpatient follow-up.      Final diagnoses:  Cyst of left ovary    ED Discharge Orders     None          Jerrol Agent, MD 12/20/23 1131

## 2023-12-26 ENCOUNTER — Other Ambulatory Visit (HOSPITAL_COMMUNITY): Payer: Self-pay

## 2023-12-26 DIAGNOSIS — Z6837 Body mass index (BMI) 37.0-37.9, adult: Secondary | ICD-10-CM | POA: Diagnosis not present

## 2023-12-26 DIAGNOSIS — E66812 Obesity, class 2: Secondary | ICD-10-CM | POA: Diagnosis not present

## 2023-12-26 DIAGNOSIS — E282 Polycystic ovarian syndrome: Secondary | ICD-10-CM | POA: Diagnosis not present

## 2023-12-27 ENCOUNTER — Other Ambulatory Visit (HOSPITAL_COMMUNITY): Payer: Self-pay

## 2023-12-27 ENCOUNTER — Other Ambulatory Visit: Payer: Self-pay

## 2023-12-27 ENCOUNTER — Encounter: Payer: Self-pay | Admitting: Pharmacist

## 2023-12-28 ENCOUNTER — Other Ambulatory Visit (HOSPITAL_COMMUNITY): Payer: Self-pay

## 2023-12-28 ENCOUNTER — Encounter (HOSPITAL_COMMUNITY): Payer: Self-pay

## 2023-12-28 MED ORDER — APRI 0.15-30 MG-MCG PO TABS
ORAL_TABLET | ORAL | 4 refills | Status: AC
  Filled 2023-12-28: qty 84, 63d supply, fill #0

## 2024-01-10 DIAGNOSIS — N3944 Nocturnal enuresis: Secondary | ICD-10-CM | POA: Diagnosis not present

## 2024-01-18 ENCOUNTER — Other Ambulatory Visit (HOSPITAL_COMMUNITY): Payer: Self-pay | Admitting: Psychiatry

## 2024-01-19 ENCOUNTER — Encounter (HOSPITAL_COMMUNITY): Payer: Self-pay

## 2024-01-19 ENCOUNTER — Other Ambulatory Visit (HOSPITAL_COMMUNITY): Payer: Self-pay

## 2024-01-19 ENCOUNTER — Other Ambulatory Visit: Payer: Self-pay

## 2024-01-19 ENCOUNTER — Encounter: Payer: Self-pay | Admitting: Pharmacist

## 2024-01-19 MED ORDER — METFORMIN HCL ER 500 MG PO TB24
500.0000 mg | ORAL_TABLET | Freq: Every day | ORAL | 1 refills | Status: DC
  Filled 2024-01-19: qty 90, 90d supply, fill #0
  Filled 2024-04-20 – 2024-04-22 (×3): qty 90, 90d supply, fill #1

## 2024-01-19 MED ORDER — ESCITALOPRAM OXALATE 20 MG PO TABS
20.0000 mg | ORAL_TABLET | Freq: Every day | ORAL | 0 refills | Status: DC
  Filled 2024-01-19 (×2): qty 90, 90d supply, fill #0

## 2024-01-19 MED ORDER — ARIPIPRAZOLE 5 MG PO TABS
5.0000 mg | ORAL_TABLET | Freq: Every day | ORAL | 0 refills | Status: DC
  Filled 2024-01-19 (×2): qty 90, 90d supply, fill #0

## 2024-01-24 ENCOUNTER — Other Ambulatory Visit (HOSPITAL_COMMUNITY): Payer: Self-pay

## 2024-01-24 ENCOUNTER — Other Ambulatory Visit: Payer: Self-pay

## 2024-01-24 DIAGNOSIS — F331 Major depressive disorder, recurrent, moderate: Secondary | ICD-10-CM | POA: Diagnosis not present

## 2024-01-24 DIAGNOSIS — R7303 Prediabetes: Secondary | ICD-10-CM | POA: Diagnosis not present

## 2024-01-24 DIAGNOSIS — N3944 Nocturnal enuresis: Secondary | ICD-10-CM | POA: Diagnosis not present

## 2024-01-24 DIAGNOSIS — F411 Generalized anxiety disorder: Secondary | ICD-10-CM | POA: Diagnosis not present

## 2024-01-24 DIAGNOSIS — Z6837 Body mass index (BMI) 37.0-37.9, adult: Secondary | ICD-10-CM | POA: Diagnosis not present

## 2024-01-24 DIAGNOSIS — F900 Attention-deficit hyperactivity disorder, predominantly inattentive type: Secondary | ICD-10-CM | POA: Diagnosis not present

## 2024-01-24 DIAGNOSIS — E66812 Obesity, class 2: Secondary | ICD-10-CM | POA: Diagnosis not present

## 2024-01-24 MED ORDER — DESMOPRESSIN ACETATE 0.2 MG PO TABS
0.6000 mg | ORAL_TABLET | Freq: Every day | ORAL | 3 refills | Status: AC
  Filled 2024-01-24 – 2024-02-13 (×2): qty 270, 90d supply, fill #0

## 2024-01-24 MED ORDER — FLUCONAZOLE 150 MG PO TABS
150.0000 mg | ORAL_TABLET | Freq: Every day | ORAL | 0 refills | Status: DC
  Filled 2024-01-24: qty 3, 3d supply, fill #0

## 2024-02-01 ENCOUNTER — Other Ambulatory Visit: Payer: Self-pay

## 2024-02-01 DIAGNOSIS — Z006 Encounter for examination for normal comparison and control in clinical research program: Secondary | ICD-10-CM

## 2024-02-13 ENCOUNTER — Other Ambulatory Visit: Payer: Self-pay

## 2024-02-13 ENCOUNTER — Other Ambulatory Visit (HOSPITAL_COMMUNITY): Payer: Self-pay

## 2024-04-01 ENCOUNTER — Telehealth (HOSPITAL_COMMUNITY): Admitting: Psychiatry

## 2024-04-02 DIAGNOSIS — F331 Major depressive disorder, recurrent, moderate: Secondary | ICD-10-CM | POA: Diagnosis not present

## 2024-04-02 DIAGNOSIS — F900 Attention-deficit hyperactivity disorder, predominantly inattentive type: Secondary | ICD-10-CM | POA: Diagnosis not present

## 2024-04-02 DIAGNOSIS — F411 Generalized anxiety disorder: Secondary | ICD-10-CM | POA: Diagnosis not present

## 2024-04-08 ENCOUNTER — Encounter (HOSPITAL_COMMUNITY): Payer: Self-pay | Admitting: Psychiatry

## 2024-04-08 ENCOUNTER — Other Ambulatory Visit: Payer: Self-pay

## 2024-04-08 ENCOUNTER — Telehealth (HOSPITAL_COMMUNITY): Admitting: Psychiatry

## 2024-04-08 ENCOUNTER — Other Ambulatory Visit (HOSPITAL_COMMUNITY): Payer: Self-pay

## 2024-04-08 DIAGNOSIS — G4700 Insomnia, unspecified: Secondary | ICD-10-CM | POA: Diagnosis not present

## 2024-04-08 DIAGNOSIS — F331 Major depressive disorder, recurrent, moderate: Secondary | ICD-10-CM | POA: Diagnosis not present

## 2024-04-08 DIAGNOSIS — G4719 Other hypersomnia: Secondary | ICD-10-CM | POA: Diagnosis not present

## 2024-04-08 DIAGNOSIS — E669 Obesity, unspecified: Secondary | ICD-10-CM | POA: Diagnosis not present

## 2024-04-08 DIAGNOSIS — R0683 Snoring: Secondary | ICD-10-CM | POA: Diagnosis not present

## 2024-04-08 DIAGNOSIS — F3130 Bipolar disorder, current episode depressed, mild or moderate severity, unspecified: Secondary | ICD-10-CM

## 2024-04-08 DIAGNOSIS — F411 Generalized anxiety disorder: Secondary | ICD-10-CM | POA: Diagnosis not present

## 2024-04-08 MED ORDER — ESCITALOPRAM OXALATE 20 MG PO TABS
20.0000 mg | ORAL_TABLET | Freq: Every day | ORAL | 0 refills | Status: AC
  Filled 2024-04-08: qty 90, 90d supply, fill #0

## 2024-04-08 MED ORDER — WEGOVY 0.25 MG/0.5ML ~~LOC~~ SOAJ: SUBCUTANEOUS | 1 refills | Status: DC

## 2024-04-08 MED ORDER — ARIPIPRAZOLE 5 MG PO TABS
5.0000 mg | ORAL_TABLET | Freq: Every day | ORAL | 0 refills | Status: AC
  Filled 2024-04-08: qty 90, 90d supply, fill #0

## 2024-04-08 NOTE — Progress Notes (Signed)
 [facility] Follow up visit  Patient Identification: Wm Fruchter MRN:  [id number] Date of Evaluation:  04/08/2024 Referral Source: primary care Chief Complaint:   No chief complaint on file.  Follow up depression, anxiety   Visit Diagnosis:    ICD-10-CM   1. Bipolar I disorder, most recent episode depressed (HCC)  F31.30     2. GAD (generalized anxiety disorder)  F41.1      MDD recurrent, rule out bipolar depressed GAD,  Tiredness  Virtual Visit via Video Note  I connected with Saraiya Cothern on 04/08/2024 at 11:30 AM EST by a video enabled telemedicine application and verified that I am speaking with the correct person using two identifiers.  Location: Patient: work Provider: home office   I discussed the limitations of evaluation and management by telemedicine and the availability of in person appointments. The patient expressed understanding and agreed to proceed.      I discussed the assessment and treatment plan with the patient. The patient was provided an opportunity to ask questions and all were answered. The patient agreed with the plan and demonstrated an understanding of the instructions.   The patient was advised to call back or seek an in-person evaluation if the symptoms worsen or if the condition fails to improve as anticipated.  I provided 16 minutes of non-face-to-face time during this encounter.    History of Present Illness: Patient is a 23 years old currently single African-American female who is living with her adoptive mom, sister  initially by primary care physician to establish care and transfer from [facility].  On evaluation patient is doing reasonable mood wise.  We discussed about fatty liver and patient is getting testing done she is also prediabetic and she is opting for a weight loss medication to help and regarding that considering she has fatty liver  Medication concerns if any were also discussed she wants to continue with Abilify  and  Lexapro  Abilify  is a small dose no involuntary movements noticeable she follows with primary care physician regarding her labs and is also on metformin   No tremors  Aggravating factors: biological parents interference when young, some job stress Modifying factor; mom, sister  Severity manageable   Past Psychiatric History: depression, anxiety  Previous Psychotropic Medications: No   Substance Abuse History in the last 12 months:  No.  Consequences of Substance Abuse: NA  Past Medical History:  Past Medical History:  Diagnosis Date   Allergy    Anemia    Anxiety    Depression    Diabetes mellitus without complication (HCC)    Heart murmur    PCOS (polycystic ovarian syndrome)    Vitamin D  deficiency     Past Surgical History:  Procedure Laterality Date   TONSILLECTOMY     TYMPANOSTOMY TUBE PLACEMENT      Family Psychiatric History: biological parents not sure, maybe grand ma had some sickness  Family History:  Family History  Problem Relation Age of Onset   ADD / ADHD Mother    Anxiety disorder Mother    Depression Mother    Diabetes Mother    Obesity Mother     Social History:   Social History   Socioeconomic History   Marital status: Single    Spouse name: Not on file   Number of children: 0   Years of education: Not on file   Highest education level: Some college, no degree  Occupational History   Not on file  Tobacco Use   Smoking  status: Never   Smokeless tobacco: Never   Tobacco comments:    Never smoked before in my life  Vaping Use   Vaping status: Never Used  Substance and Sexual Activity   Alcohol use: Never   Drug use: Never   Sexual activity: Never  Other Topics Concern   Not on file  Social History Narrative   Not on file   Social Drivers of Health   Tobacco Use: Low Risk (04/08/2024)   Patient History    Smoking Tobacco Use: Never    Smokeless Tobacco Use: Never    Passive Exposure: Not on file  Financial Resource Strain:  Low Risk (05/23/2023)   Overall Financial Resource Strain (CARDIA)    Difficulty of Paying Living Expenses: Not very hard  Food Insecurity: Low Risk (01/24/2024)   Received from [facility]   Epic    Within the past 12 months, you worried that your food would run out before you got money to buy more: Never true    Within the past 12 months, the food you bought just didn't last and you didn't have money to get more. : Never true  Recent Concern: Food Insecurity - Medium Risk (12/22/2023)   Received from [facility]   Epic    Within the past 12 months, you worried that your food would run out before you got money to buy more: Sometimes true    Within the past 12 months, the food you bought just didn't last and you didn't have money to get more. : Never true  Transportation Needs: No Transportation Needs (01/24/2024)   Received from [facility]    In the past 12 months, has lack of reliable transportation kept you from medical appointments, meetings, work or from getting things needed for daily living? : No  Physical Activity: Inactive (05/24/2023)   Exercise Vital Sign    Days of Exercise per Week: 0 days    Minutes of Exercise per Session: 0 min  Stress: Stress Concern Present (05/23/2023)   [employer] of Occupational Health - Occupational Stress Questionnaire    Feeling of Stress : To some extent  Social Connections: Moderately Integrated (05/23/2023)   Social Connection and Isolation Panel    Frequency of Communication with Friends and Family: More than three times a week    Frequency of Social Gatherings with Friends and Family: Once a week    Attends Religious Services: More than 4 times per year    Active Member of Clubs or Organizations: Yes    Attends Banker Meetings: More than 4 times per year    Marital Status: Never married  Depression (PHQ2-9): High Risk (05/24/2023)   Depression (PHQ2-9)    PHQ-2 Score: 18  Alcohol Screen: Low Risk  (05/24/2023)   Alcohol Screen    Last Alcohol Screening Score (AUDIT): 0  Housing: Low Risk (01/24/2024)   Received from [facility]   Epic    What is your living situation today?: I have a steady place to live    Think about the place you live. Do you have problems with any of the following? Choose all that apply:: None/None on this list  Utilities: Low Risk (01/24/2024)   Received from [facility]   Utilities    In the past 12 months has the electric, gas, oil, or water company threatened to shut off services in your home? : No  Health Literacy: Adequate Health Literacy (05/24/2023)   B1300 Health Literacy  Frequency of need for help with medical instructions: Never    Allergies:   Allergies  Allergen Reactions   Lactose Intolerance (Gi)     Metabolic Disorder Labs: No results found for: HGBA1C, MPG No results found for: PROLACTIN No results found for: CHOL, TRIG, HDL, CHOLHDL, VLDL, LDLCALC No results found for: TSH  Therapeutic Level Labs: No results found for: LITHIUM No results found for: CBMZ No results found for: VALPROATE  Current Medications: Current Outpatient Medications  Medication Sig Dispense Refill   ARIPiprazole  (ABILIFY ) 5 MG tablet Take 1 tablet (5 mg total) by mouth daily. 90 tablet 0   augmented betamethasone  dipropionate (DIPROLENE -AF) 0.05 % ointment Apply a small amount to skin twice a day 50 g 3   desmopressin  (DDAVP ) 0.2 MG tablet Take 3 tablets (0.6 mg total) by mouth daily. 270 tablet 3   desogestrel -ethinyl estradiol  (APRI ) 0.15-30 MG-MCG tablet Take 1 tablet by mouth daily. 84 tablet 4   desogestrel -ethinyl estradiol  (APRI ) 0.15-30 MG-MCG tablet Take 1 tablet by mouth daily. In a continuous fashion skipping the sugar pills 84 tablet 4   escitalopram  (LEXAPRO ) 20 MG tablet Take 1 tablet (20 mg total) by mouth at bedtime. 90 tablet 0   ferrous sulfate 325 (65 FE) MG tablet Take 325 mg by mouth daily with breakfast.      fluconazole  (DIFLUCAN ) 150 MG tablet Take 1 tablet (150 mg total) by mouth daily. 3 tablet 0   metFORMIN  (GLUCOPHAGE -XR) 500 MG 24 hr tablet Take 500 mg by mouth 2 (two) times daily.     metFORMIN  (GLUCOPHAGE -XR) 500 MG 24 hr tablet Take 1 tablet (500 mg total) by mouth daily with breakfast. 90 tablet 1   ondansetron  (ZOFRAN ) 4 MG tablet Take 1 tablet (4 mg total) by mouth every 12 (twelve) hours as needed for nausea or vomiting. 30 tablet 0   tacrolimus  (PROTOPIC ) 0.1 % ointment Apply a small amount to affected area twice a day 30 g 11   tirzepatide  (ZEPBOUND ) 2.5 MG/0.5ML Pen Inject 2.5 mg into the skin every 7 (seven) days. 2 mL 0   No current facility-administered medications for this visit.    Psychiatric Specialty Exam: Review of Systems  Cardiovascular:  Negative for chest pain.  Psychiatric/Behavioral:  Negative for agitation, dysphoric mood and sleep disturbance.     There were no vitals taken for this visit.There is no height or weight on file to calculate BMI.  General Appearance: Casual  Eye Contact:  Fair  Speech:  Clear and Coherent  Volume:  Normal  Mood:  fair  Affect:  Constricted  Thought Process:  Goal Directed  Orientation:  Full (Time, Place, and Person)  Thought Content:  Logical  Suicidal Thoughts:  No  Homicidal Thoughts:  No  Memory:  Immediate;   Fair  Judgement:  Fair  Insight:  Fair  Psychomotor Activity:  Normal  Concentration:  Concentration: Fair  Recall:  Fair  Fund of Knowledge:Good  Language: Good  Akathisia:  No  Handed:    AIMS (if indicated):  no involuntary movement  Assets:  Desire for Improvement Housing  ADL's:  Intact  Cognition: WNL  Sleep:  Fair   Screenings: GAD-7    Garment/textile Technologist Visit from 05/24/2023 in [facility] [place]  Total GAD-7 Score 9   PHQ2-9    Flowsheet Row Office Visit from 05/24/2023 in [facility] [place] Nutrition from 12/29/2022 in [facility]  Diab Ed  - A Dept  Of [place] Office Visit from 05/14/2022 in [facility] from 01/18/2020 in [facility] Emergency Department at [facility]  PHQ-2 Total Score 2 0 1 2  PHQ-9 Total Score 18 -- -- 6   Flowsheet Row ED from 12/20/2023 in [facility] Emergency Department at [facility] Office Visit from 05[facility]14/2022 in [facility] from 01/18/2020 in [facility] Department at [facility]  C-SSRS RISK CATEGORY No Risk No Risk Error: Q2 is Yes, you must answer 3, 4, and 5    Assessment and Plan: as follows Prior documentation reviewed   MDD recurrent moderate: Manageable continue Abilify  is a small dose continue monitor and she is being evaluated for fatty liver continue Lexapro  No clear history of manic episodes,  GAD: Manageable continue Lexapro    insomnia: Did not endorse any significant concerns continue monitoring sleep and sleep hygiene   FU 3 m. Renewed meds  Collaboration of Care: Primary Care Provider AEB reviewed notes and referral  Patient/Guardian was advised Release of Information must be obtained prior to any record release in order to collaborate their care with an outside provider. Patient/Guardian was advised if they have not already done so to contact the registration department to sign all necessary forms in order for us  to release information regarding their care.   Consent: Patient/Guardian gives verbal consent for treatment and assignment of benefits for services provided during this visit. Patient/Guardian expressed understanding and agreed to proceed.   Jackey Flight, MD 12/22/202511:29 AM

## 2024-04-09 ENCOUNTER — Other Ambulatory Visit (HOSPITAL_COMMUNITY): Payer: Self-pay

## 2024-04-12 ENCOUNTER — Other Ambulatory Visit (HOSPITAL_COMMUNITY): Payer: Self-pay

## 2024-04-12 MED ORDER — OZEMPIC (0.25 OR 0.5 MG/DOSE) 2 MG/3ML ~~LOC~~ SOPN
0.2500 mg | PEN_INJECTOR | SUBCUTANEOUS | 0 refills | Status: DC
  Filled 2024-04-12: qty 3, 56d supply, fill #0
  Filled 2024-04-16: qty 3, 28d supply, fill #0

## 2024-04-13 ENCOUNTER — Other Ambulatory Visit (HOSPITAL_COMMUNITY): Payer: Self-pay

## 2024-04-15 ENCOUNTER — Other Ambulatory Visit (HOSPITAL_COMMUNITY): Payer: Self-pay

## 2024-04-15 ENCOUNTER — Telehealth (HOSPITAL_COMMUNITY): Admitting: Psychiatry

## 2024-04-16 ENCOUNTER — Other Ambulatory Visit (HOSPITAL_COMMUNITY): Payer: Self-pay

## 2024-04-20 ENCOUNTER — Other Ambulatory Visit (HOSPITAL_COMMUNITY): Payer: Self-pay

## 2024-04-22 ENCOUNTER — Encounter (HOSPITAL_COMMUNITY): Payer: Self-pay

## 2024-04-22 ENCOUNTER — Other Ambulatory Visit (HOSPITAL_COMMUNITY): Payer: Self-pay

## 2024-04-23 ENCOUNTER — Other Ambulatory Visit (HOSPITAL_COMMUNITY): Payer: Self-pay

## 2024-05-02 ENCOUNTER — Other Ambulatory Visit (HOSPITAL_COMMUNITY): Payer: Self-pay

## 2024-05-02 MED ORDER — METFORMIN HCL ER 500 MG PO TB24
500.0000 mg | ORAL_TABLET | Freq: Two times a day (BID) | ORAL | 1 refills | Status: AC
  Filled 2024-05-02: qty 180, 90d supply, fill #0

## 2024-05-02 MED ORDER — WEGOVY 0.5 MG/0.5ML ~~LOC~~ SOAJ
0.5000 mg | SUBCUTANEOUS | 0 refills | Status: AC
  Filled 2024-05-02: qty 2, 28d supply, fill #0

## 2024-05-03 ENCOUNTER — Other Ambulatory Visit (HOSPITAL_COMMUNITY): Payer: Self-pay

## 2024-05-08 ENCOUNTER — Other Ambulatory Visit (HOSPITAL_COMMUNITY): Payer: Self-pay

## 2024-05-08 ENCOUNTER — Encounter (HOSPITAL_COMMUNITY): Payer: Self-pay

## 2024-05-09 ENCOUNTER — Other Ambulatory Visit (HOSPITAL_COMMUNITY): Payer: Self-pay

## 2024-05-13 ENCOUNTER — Other Ambulatory Visit (HOSPITAL_COMMUNITY): Payer: Self-pay

## 2024-07-08 ENCOUNTER — Telehealth (HOSPITAL_COMMUNITY): Admitting: Psychiatry
